# Patient Record
Sex: Male | Born: 1970
Health system: Southern US, Community
[De-identification: ages and names within clinical notes are randomized; demographics above are authoritative.]

## PROBLEM LIST (undated history)

## (undated) DIAGNOSIS — I10 Essential (primary) hypertension: Secondary | ICD-10-CM

---

## 2016-01-06 ENCOUNTER — Emergency Department (HOSPITAL_BASED_OUTPATIENT_CLINIC_OR_DEPARTMENT_OTHER)
Admission: EM | Admit: 2016-01-06 | Discharge: 2016-01-06 | Disposition: A | Discharge status: Home / Self Care | Payer: Self-pay | Attending: Emergency Medicine | Admitting: Emergency Medicine

## 2016-01-06 ENCOUNTER — Encounter (HOSPITAL_BASED_OUTPATIENT_CLINIC_OR_DEPARTMENT_OTHER): Payer: Self-pay | Admitting: Emergency Medicine

## 2016-01-06 DIAGNOSIS — F17210 Nicotine dependence, cigarettes, uncomplicated: Secondary | ICD-10-CM | POA: Insufficient documentation

## 2016-01-06 DIAGNOSIS — Z202 Contact with and (suspected) exposure to infections with a predominantly sexual mode of transmission: Secondary | ICD-10-CM | POA: Insufficient documentation

## 2016-01-06 MED ORDER — METRONIDAZOLE 500 MG PO TABS: 500.0000 mg | ORAL_TABLET | Freq: Two times a day (BID) | ORAL | 0 refills | Status: DC

## 2016-01-06 MED ORDER — METRONIDAZOLE 500 MG PO TABS
2000.0000 mg | ORAL_TABLET | Freq: Once | ORAL | Status: AC
  Administered 2016-01-06: 2000 mg via ORAL
  Filled 2016-01-06: qty 4

## 2016-01-06 NOTE — ED Provider Notes (Signed)
[facility]   CSN: [id number] Arrival date & time: 01/06/16  0130     History   Chief Complaint Chief Complaint  Patient presents with  . STD check    HPI Elijah Wood is a 45 y.o. male.  Patient is a 45 year old male who presents with complaints of STD exposure. He reports his significant other was recently diagnosed with trichomonas. The patient has no symptoms otherwise. He denies any dysuria, urethral discharge, fevers, abdominal pain.   The history is provided by the patient.    History reviewed. No pertinent past medical history.  There are no active problems to display for this patient.   History reviewed. No pertinent surgical history.     Home Medications    Prior to Admission medications   Medication Sig Start Date End Date Taking? Authorizing Provider  metroNIDAZOLE (FLAGYL) 500 MG tablet Take 1 tablet (500 mg total) by mouth 2 (two) times daily. 01/06/16   Geoffery Lyonsouglas Rue Tinnel, MD    Family History No family history on file.  Social History Social History  Substance Use Topics  . Smoking status: Current Every Day Smoker    Packs/day: 0.50    Types: Cigarettes  . Smokeless tobacco: Never Used  . Alcohol use Yes     Comment: rarely     Allergies   Patient has no known allergies.   Review of Systems Review of Systems  All other systems reviewed and are negative.    Physical Exam Updated Vital Signs BP 148/99 (BP Location: Left Arm)   Pulse 66   Temp 98.4 F (36.9 C) (Oral)   Resp 16   Ht 6\' 2"  (1.88 m)   Wt 217 lb 3.2 oz (98.5 kg)   SpO2 98%   BMI 27.89 kg/m   Physical Exam  Constitutional: He is oriented to person, place, and time. He appears well-developed and well-nourished. No distress.  HENT:  Head: Normocephalic and atraumatic.  Mouth/Throat: Oropharynx is clear and moist.  Neck: Normal range of motion. Neck supple.  Cardiovascular: Normal rate and regular rhythm.  Exam reveals no friction rub.    No murmur heard. Pulmonary/Chest: Effort normal and breath sounds normal. No respiratory distress. He has no wheezes. He has no rales.  Abdominal: Soft. Bowel sounds are normal. He exhibits no distension. There is no tenderness.  Musculoskeletal: Normal range of motion. He exhibits no edema.  Neurological: He is alert and oriented to person, place, and time. Coordination normal.  Skin: Skin is warm and dry. He is not diaphoretic.  Nursing note and vitals reviewed.    ED Treatments / Results  Labs (all labs ordered are listed, but only abnormal results are displayed) Labs Reviewed - No data to display  EKG  EKG Interpretation None       Radiology No results found.  Procedures Procedures (including critical care time)  Medications Ordered in ED Medications  metroNIDAZOLE (FLAGYL) tablet 2,000 mg (2,000 mg Oral Given 01/06/16 0221)     Initial Impression / Assessment and Plan / ED Course  I have reviewed the triage vital signs and the nursing notes.  Pertinent labs & imaging results that were available during my care of the patient were reviewed by me and considered in my medical decision making (see chart for details).  Clinical Course     Patient was offered testing for gonorrhea and chlamydia, however he declines. He tells me he has had these in the past and this is not it. He  is just requesting treatment for the trichomonas which his significant other was recently diagnosed with. He will be given by mouth Flagyl and discharged to home. To return as needed for any problems. He was also advised to avoid sexual contact with his partner for 2 weeks to avoid reinfection.  Final Clinical Impressions(s) / ED Diagnoses   Final diagnoses:  STD exposure    New Prescriptions Discharge Medication List as of 01/06/2016  2:06 AM    START taking these medications   Details  metroNIDAZOLE (FLAGYL) 500 MG tablet Take 1 tablet (500 mg total) by mouth 2 (two) times daily.,  Starting Wed 01/06/2016, Print         Geoffery Lyonsouglas Jairo Bellew, MD 01/06/16 [phone]

## 2016-01-06 NOTE — ED Triage Notes (Signed)
Pt has been exposed to Trich. Wants to be checked.

## 2016-01-06 NOTE — ED Notes (Signed)
Pt verbalizes understanding of d/c instructions and denies any further needs at this time. 

## 2016-01-06 NOTE — Discharge Instructions (Signed)
Flagyl as prescribed.  Have only protected sex for the next 2 weeks.

## 2016-01-06 NOTE — ED Notes (Signed)
Pt denies any symptoms and per Dr. Judd Lienelo, declined any testing for STDs at this time, just wants treatment for what he was reportedly exposed to.

## 2016-01-29 ENCOUNTER — Emergency Department (HOSPITAL_BASED_OUTPATIENT_CLINIC_OR_DEPARTMENT_OTHER)
Admission: EM | Admit: 2016-01-29 | Discharge: 2016-01-29 | Disposition: A | Discharge status: Home / Self Care | Payer: Self-pay | Attending: Emergency Medicine | Admitting: Emergency Medicine

## 2016-01-29 ENCOUNTER — Encounter (HOSPITAL_BASED_OUTPATIENT_CLINIC_OR_DEPARTMENT_OTHER): Payer: Self-pay

## 2016-01-29 DIAGNOSIS — L234 Allergic contact dermatitis due to dyes: Secondary | ICD-10-CM | POA: Insufficient documentation

## 2016-01-29 DIAGNOSIS — F17210 Nicotine dependence, cigarettes, uncomplicated: Secondary | ICD-10-CM | POA: Insufficient documentation

## 2016-01-29 MED ORDER — METHYLPREDNISOLONE 4 MG PO TBPK: ORAL_TABLET | ORAL | 0 refills | Status: DC

## 2016-01-29 NOTE — ED Triage Notes (Signed)
Pt had a new hair product applied to his scalp and facial hair today and has had burning, itching, redness and swelling since this evening.  He has attempted to wash the product completely out, just got off work and has not been able to take any meds for symptoms.

## 2016-01-29 NOTE — ED Notes (Signed)
Pt verbalizes understanding of d/c instructions and denies any further needs at this time. 

## 2016-01-29 NOTE — ED Provider Notes (Signed)
   [facility] Provider Note: Lowella DellJ. Lane Owin Vignola, MD, FACEP  CSN: [id number] MRN: [id number] ARRIVAL: 01/29/16 at 0338 ROOM: [facility]   CHIEF COMPLAINT  Allergic Reaction   HISTORY OF PRESENT ILLNESS  Elijah Wood is a 45 y.o. male who used an over-the-counter hair dye on his scalp and beard yesterday morning. He has never used this product before. He has subsequently developed itching, swelling, erythema and weeping of the areas reapplied the product. He states his scalp feels tight. Symptoms are moderate. He has attempted to force the product out of his hair but without relief. He denies systemic symptoms such as throat swelling, shortness breath, nausea, vomiting or diarrhea.   History reviewed. No pertinent past medical history.  History reviewed. No pertinent surgical history.  No family history on file.  Social History  Substance Use Topics  . Smoking status: Current Every Day Smoker    Packs/day: 0.50    Types: Cigarettes  . Smokeless tobacco: Never Used  . Alcohol use Yes     Comment: rarely    Prior to Admission medications   Medication Sig Start Date End Date Taking? Authorizing Provider  metroNIDAZOLE (FLAGYL) 500 MG tablet Take 1 tablet (500 mg total) by mouth 2 (two) times daily. 01/06/16   Geoffery Lyonsouglas Delo, MD    Allergies Patient has no known allergies.   REVIEW OF SYSTEMS  Negative except as noted here or in the History of Present Illness.   PHYSICAL EXAMINATION  Initial Vital Signs Blood pressure 134/84, pulse 66, temperature 98.5 F (36.9 C), temperature source Oral, resp. rate 18, height 6\' 2"  (1.88 m), weight 220 lb (99.8 kg), SpO2 98 %.  Examination General: Well-developed, well-nourished male in no acute distress; appearance consistent with age of record HENT: normocephalic; atraumatic Eyes: Normal appearance Neck: supple Heart: regular rate and rhythm Lungs: clear to auscultation bilaterally Abdomen: soft;  nondistended Extremities: No deformity; full range of motion Neurologic: Awake, alert and oriented; motor function intact in all extremities and symmetric; no facial droop Skin: Warm and dry; erythema, edema and areas of weeping of scalp and bearded skin Psychiatric: Normal mood and affect   RESULTS  Summary of this visit's results, reviewed by myself:   EKG Interpretation  Date/Time:    Ventricular Rate:    PR Interval:    QRS Duration:   QT Interval:    QTC Calculation:   R Axis:     Text Interpretation:        Laboratory Studies: No results found for this or any previous visit (from the past 24 hour(s)). Imaging Studies: No results found.  ED COURSE  Nursing notes and initial vitals signs, including pulse oximetry, reviewed.  Vitals:   01/29/16 0349  BP: 134/84  Pulse: 66  Resp: 18  Temp: 98.5 F (36.9 C)  TempSrc: Oral  SpO2: 98%  Weight: 220 lb (99.8 kg)  Height: 6\' 2"  (1.88 m)   The patient was advised to avoid such hair dye products in the future. We will treated with steroid taper.  PROCEDURES    ED DIAGNOSES     ICD-9-CM ICD-10-CM   1. Allergic contact dermatitis due to dyes 692.89 L23.4        Paula LibraJohn Shloima Clinch, MD 01/29/16 [phone]

## 2016-03-05 ENCOUNTER — Emergency Department (HOSPITAL_BASED_OUTPATIENT_CLINIC_OR_DEPARTMENT_OTHER)
Admission: EM | Admit: 2016-03-05 | Discharge: 2016-03-05 | Disposition: A | Discharge status: Home / Self Care | Payer: Self-pay | Attending: Emergency Medicine | Admitting: Emergency Medicine

## 2016-03-05 ENCOUNTER — Encounter (HOSPITAL_BASED_OUTPATIENT_CLINIC_OR_DEPARTMENT_OTHER): Payer: Self-pay | Admitting: Emergency Medicine

## 2016-03-05 ENCOUNTER — Emergency Department (HOSPITAL_BASED_OUTPATIENT_CLINIC_OR_DEPARTMENT_OTHER): Payer: Self-pay

## 2016-03-05 DIAGNOSIS — J069 Acute upper respiratory infection, unspecified: Secondary | ICD-10-CM

## 2016-03-05 DIAGNOSIS — F17210 Nicotine dependence, cigarettes, uncomplicated: Secondary | ICD-10-CM | POA: Insufficient documentation

## 2016-03-05 DIAGNOSIS — J111 Influenza due to unidentified influenza virus with other respiratory manifestations: Secondary | ICD-10-CM | POA: Insufficient documentation

## 2016-03-05 DIAGNOSIS — Z79899 Other long term (current) drug therapy: Secondary | ICD-10-CM | POA: Insufficient documentation

## 2016-03-05 MED ORDER — ACETAMINOPHEN ER 650 MG PO TBCR: 650.0000 mg | EXTENDED_RELEASE_TABLET | Freq: Three times a day (TID) | ORAL | 0 refills | Status: DC | PRN

## 2016-03-05 MED ORDER — ACETAMINOPHEN 500 MG PO TABS
1000.0000 mg | ORAL_TABLET | Freq: Once | ORAL | Status: AC
Start: 2016-03-05 — End: 2016-03-05
  Administered 2016-03-05: 1000 mg via ORAL
  Filled 2016-03-05: qty 2

## 2016-03-05 MED ORDER — IBUPROFEN 600 MG PO TABS: 600.0000 mg | ORAL_TABLET | Freq: Four times a day (QID) | ORAL | 0 refills | Status: DC | PRN

## 2016-03-05 NOTE — ED Provider Notes (Signed)
[facility]   CSN: [id number] Arrival date & time: 03/05/16  0140     History   Chief Complaint Chief Complaint  Patient presents with  . Influenza    HPI Elijah Wood is a 46 y.o. male.  HPI 46 y/o M comes in with cc of cough, aches. Pt started having chills on thusday, then he had body aches, and cough, sore throat. Pt had subjective fevers. Pt hasnt had a flu shot. Cough is wet, but pt isnt able to spit anything out. Pt wasn't feeling well at work, but after work he felt really bad - so he came to the ER.  History reviewed. No pertinent past medical history.  There are no active problems to display for this patient.   History reviewed. No pertinent surgical history.     Home Medications    Prior to Admission medications   Medication Sig Start Date End Date Taking? Authorizing Provider  acetaminophen (TYLENOL 8 HOUR) 650 MG CR tablet Take 1 tablet (650 mg total) by mouth every 8 (eight) hours as needed for pain. 03/05/16   Derwood Kaplan, MD  ibuprofen (ADVIL,MOTRIN) 600 MG tablet Take 1 tablet (600 mg total) by mouth every 6 (six) hours as needed. 03/05/16   Derwood Kaplan, MD  methylPREDNISolone (MEDROL DOSEPAK) 4 MG TBPK tablet Take tapering dose per package instructions. 01/29/16   John Molpus, MD  metroNIDAZOLE (FLAGYL) 500 MG tablet Take 1 tablet (500 mg total) by mouth 2 (two) times daily. 01/06/16   Geoffery Lyons, MD    Family History No family history on file.  Social History Social History  Substance Use Topics  . Smoking status: Current Every Day Smoker    Packs/day: 0.50    Types: Cigarettes  . Smokeless tobacco: Never Used  . Alcohol use Yes     Comment: rarely     Allergies   Patient has no known allergies.   Review of Systems Review of Systems  ROS 10 Systems reviewed and are negative for acute change except as noted in the HPI.    Physical Exam Updated Vital Signs BP 176/83 (BP Location: Right Arm)    Pulse 103   Temp 100.1 F (37.8 C) (Oral)   Resp 26   Ht 6\' 2"  (1.88 m)   Wt 220 lb (99.8 kg)   SpO2 98%   BMI 28.25 kg/m   Physical Exam  Constitutional: He is oriented to person, place, and time. He appears well-developed.  HENT:  Head: Atraumatic.  Mouth/Throat: Oropharynx is clear and moist. No oropharyngeal exudate.  Neck: Neck supple.  Cardiovascular: Normal rate.   Pulmonary/Chest: Effort normal and breath sounds normal. No respiratory distress. He has no wheezes.  Lymphadenopathy:    He has cervical adenopathy.  Neurological: He is alert and oriented to person, place, and time.  Skin: Skin is warm.  Nursing note and vitals reviewed.    ED Treatments / Results  Labs (all labs ordered are listed, but only abnormal results are displayed) Labs Reviewed - No data to display  EKG  EKG Interpretation None       Radiology Dg Chest 2 View  Result Date: 03/05/2016 CLINICAL DATA:  Cough and congestion. EXAM: CHEST  2 VIEW COMPARISON:  None. FINDINGS: The heart size and mediastinal contours are within normal limits. Both lungs are clear. The visualized skeletal structures are unremarkable. IMPRESSION: No active cardiopulmonary disease. Electronically Signed   By: Tollie Eth M.D.   On: 03/05/2016 03:24  Procedures Procedures (including critical care time)  Medications Ordered in ED Medications  acetaminophen (TYLENOL) tablet 1,000 mg (1,000 mg Oral Given 03/05/16 0149)     Initial Impression / Assessment and Plan / ED Course  I have reviewed the triage vital signs and the nursing notes.  Pertinent labs & imaging results that were available during my care of the patient were reviewed by me and considered in my medical decision making (see chart for details).     Pt appears to have viral URI and possibly flu. He is non toxic appearing, immunocompetent individual. PT feels a lot better after he got tylenol here, and his temp has gone down. Strict ER return  precautions have been discussed, and patient is agreeing with the plan and is comfortable with the workup done and the recommendations from the ER.    Final Clinical Impressions(s) / ED Diagnoses   Final diagnoses:  Influenza  Viral upper respiratory tract infection    New Prescriptions New Prescriptions   ACETAMINOPHEN (TYLENOL 8 HOUR) 650 MG CR TABLET    Take 1 tablet (650 mg total) by mouth every 8 (eight) hours as needed for pain.   IBUPROFEN (ADVIL,MOTRIN) 600 MG TABLET    Take 1 tablet (600 mg total) by mouth every 6 (six) hours as needed.     Derwood KaplanAnkit Nancee Brownrigg, MD 03/05/16 [phone]

## 2016-03-05 NOTE — ED Notes (Signed)
Generalized body aches with cough and fever for two days.  Pt states he feels much better after the tylenol.  He is drinking orange juice.

## 2016-03-05 NOTE — Discharge Instructions (Signed)
Please return to the ER if your symptoms worsen; you have increased pain, fevers, chills, inability to keep any medications down, confusion, shortness of breath. Otherwise see the outpatient doctor as requested.

## 2016-03-05 NOTE — ED Triage Notes (Signed)
Pt reports cough, congestion, sore throat body aches x 2 days.

## 2016-03-05 NOTE — ED Notes (Signed)
Pt verbalizes understanding of d/c instructions and denies any further needs at this time. 

## 2016-07-05 ENCOUNTER — Emergency Department (HOSPITAL_BASED_OUTPATIENT_CLINIC_OR_DEPARTMENT_OTHER): Payer: Self-pay

## 2016-07-05 ENCOUNTER — Emergency Department (HOSPITAL_BASED_OUTPATIENT_CLINIC_OR_DEPARTMENT_OTHER)
Admission: EM | Admit: 2016-07-05 | Discharge: 2016-07-05 | Disposition: A | Discharge status: Home / Self Care | Payer: Self-pay | Attending: Emergency Medicine | Admitting: Emergency Medicine

## 2016-07-05 ENCOUNTER — Encounter (HOSPITAL_BASED_OUTPATIENT_CLINIC_OR_DEPARTMENT_OTHER): Payer: Self-pay

## 2016-07-05 DIAGNOSIS — F17210 Nicotine dependence, cigarettes, uncomplicated: Secondary | ICD-10-CM | POA: Insufficient documentation

## 2016-07-05 DIAGNOSIS — R609 Edema, unspecified: Secondary | ICD-10-CM | POA: Insufficient documentation

## 2016-07-05 DIAGNOSIS — R42 Dizziness and giddiness: Secondary | ICD-10-CM | POA: Insufficient documentation

## 2016-07-05 DIAGNOSIS — R0789 Other chest pain: Secondary | ICD-10-CM | POA: Insufficient documentation

## 2016-07-05 LAB — CBC
HCT: 42.3 % (ref 39.0–52.0)
Hemoglobin: 14.1 g/dL (ref 13.0–17.0)
MCH: 29.7 pg (ref 26.0–34.0)
MCHC: 33.3 g/dL (ref 30.0–36.0)
MCV: 89.1 fL (ref 78.0–100.0)
Platelets: 244 10*3/uL (ref 150–400)
RBC: 4.75 MIL/uL (ref 4.22–5.81)
RDW: 13.2 % (ref 11.5–15.5)
WBC: 7.7 10*3/uL (ref 4.0–10.5)

## 2016-07-05 LAB — BASIC METABOLIC PANEL
ANION GAP: 7 (ref 5–15)
BUN: 8 mg/dL (ref 6–20)
CALCIUM: 9.3 mg/dL (ref 8.9–10.3)
CO2: 29 mmol/L (ref 22–32)
Chloride: 103 mmol/L (ref 101–111)
Creatinine, Ser: 1.2 mg/dL (ref 0.61–1.24)
GLUCOSE: 99 mg/dL (ref 65–99)
POTASSIUM: 4.3 mmol/L (ref 3.5–5.1)
SODIUM: 139 mmol/L (ref 135–145)

## 2016-07-05 LAB — TROPONIN I
Troponin I: <0.03 ng/mL (ref <0.03)
Troponin I: <0.03 ng/mL (ref <0.03)

## 2016-07-05 MED ORDER — ASPIRIN 81 MG PO CHEW
324.0000 mg | CHEWABLE_TABLET | Freq: Once | ORAL | Status: AC
  Administered 2016-07-05: 324 mg via ORAL
  Filled 2016-07-05: qty 4

## 2016-07-05 NOTE — ED Notes (Signed)
Pt inquired about leaving; RN educated pt about labs that need to be drawn in the next hour. Pt in understanding; states he will stay and wait.

## 2016-07-05 NOTE — Discharge Instructions (Signed)
You may take baby aspirin daily. You may also try over-the-counter reflux medications such as Prilosec for the next 2 weeks.  Follow-up with cardiology as soon as he can for evaluation of ear chest pain. Return to the ED if anyconcerning symptoms develop.

## 2016-07-05 NOTE — ED Notes (Signed)
ED Provider at bedside. 

## 2016-07-05 NOTE — ED Notes (Signed)
Patient transported to X-ray 

## 2016-07-05 NOTE — ED Notes (Signed)
Attempted to start IV, able to obtain blood but unable to thread iv

## 2016-07-05 NOTE — ED Notes (Signed)
Family at bedside. 

## 2016-07-05 NOTE — ED Triage Notes (Signed)
CP x 30 min-had CP last week-did not seek medical attention-NAD-steady gait

## 2016-07-05 NOTE — ED Provider Notes (Signed)
[facility]   CSN: [id number] Arrival date & time: 07/05/16  1122     History   Chief Complaint Chief Complaint  Patient presents with  . Chest Pain    HPI Elijah Wood is a 46 y.o. male who presents a with chief complaint acute onset, intermittent, progressive improving chest pain which began earlier today 1 hour ago. He states that pain is midsternal, does not radiate and feels like a "squeezing heart pain". Pain came on while he was using the restroom and was 6/10 at its worst and has improved now. Pain is intermittent and currently, not related to activity. He states that he had a very similar episode last week which lasted for a few minutes before resolving with rest. He states his initial episode today was accompanied by lightheadedness, but denies diaphoresis, syncope, nausea, vomiting, shortness of breath. He denies heart palpitations, leg swelling, orthopnea, PND. He is currently a smoker of 6-7 cigarettes daily. He has not tried anything for his symptoms. He denies history of hypertension, hyperlipidemia, angina, and has not had a stress test or catheterization in the past.  The history is provided by the patient.    History reviewed. No pertinent past medical history.  There are no active problems to display for this patient.   History reviewed. No pertinent surgical history.     Home Medications    Prior to Admission medications   Not on File    Family History No family history on file.  Social History Social History  Substance Use Topics  . Smoking status: Current Every Day Smoker    Packs/day: 0.50    Types: Cigarettes  . Smokeless tobacco: Never Used  . Alcohol use Yes     Comment: occ     Allergies   Patient has no known allergies.   Review of Systems Review of Systems  Constitutional: Negative for chills and fever.  Respiratory: Negative for shortness of breath.   Cardiovascular: Positive for chest pain.  Negative for palpitations and leg swelling.  Gastrointestinal: Negative for abdominal pain, nausea and vomiting.  Neurological: Positive for light-headedness. Negative for syncope and headaches.  All other systems reviewed and are negative.    Physical Exam Updated Vital Signs BP (!) 153/99   Pulse 62   Temp 98.4 F (36.9 C) (Oral)   Resp 17   SpO2 99%   Physical Exam  Constitutional: He appears well-developed and well-nourished.  HENT:  Head: Normocephalic and atraumatic.  Eyes: Conjunctivae are normal. Right eye exhibits no discharge. Left eye exhibits no discharge.  Neck: No JVD present. No tracheal deviation present.  Cardiovascular: Normal rate, regular rhythm, normal heart sounds and intact distal pulses.   2+ radial and DP/PT pulses bl, negative Homan's bl   Pulmonary/Chest: Effort normal and breath sounds normal. No respiratory distress. He exhibits tenderness.  Left anterior chest wall ttp near the sternum  Abdominal: Soft. Bowel sounds are normal. He exhibits no distension. There is no tenderness.  Musculoskeletal: He exhibits edema.  No midline spine TTP.  Neurological: He is alert.  Skin: Skin is warm and dry. Capillary refill takes less than 2 seconds.  Psychiatric: He has a normal mood and affect. His behavior is normal.     ED Treatments / Results  Labs (all labs ordered are listed, but only abnormal results are displayed) Labs Reviewed  CBC  TROPONIN I  BASIC METABOLIC PANEL  TROPONIN I    EKG  EKG Interpretation  Date/Time:  Tuesday Jul 05 2016 11:27:01 EDT Ventricular Rate:  75 PR Interval:  148 QRS Duration: 92 QT Interval:  372 QTC Calculation: 415 R Axis:   75 Text Interpretation:  Normal sinus rhythm Minimal voltage criteria for LVH, may be normal variant Borderline ECG No previous ECGs available Confirmed by Vanetta MuldersZackowski, Scott [phone]([id number]) on 07/05/2016 11:30:33 AM       Radiology Dg Chest 2 View  Result Date: 07/05/2016 CLINICAL DATA:   Left-sided chest pain EXAM: CHEST  2 VIEW COMPARISON:  03/05/2016 FINDINGS: The heart size and mediastinal contours are within normal limits. Both lungs are clear. The visualized skeletal structures are unremarkable. IMPRESSION: No active cardiopulmonary disease. Electronically Signed   By: Kennith CenterEric  Mansell M.D.   On: 07/05/2016 12:34    Procedures Procedures (including critical care time)  Medications Ordered in ED Medications  aspirin chewable tablet 324 mg (324 mg Oral Given 07/05/16 1255)     Initial Impression / Assessment and Plan / ED Course  I have reviewed the triage vital signs and the nursing notes.  Pertinent labs & imaging results that were available during my care of the patient were reviewed by me and considered in my medical decision making (see chart for details).     Patient with acute onset chest pain, second episode in 1 week. Afebrile, hypertensive while in ED. he states that he does not believe he has hypertension, but is not followed by primary care so he is unsure. Chest x-ray shows no active card pulmonary disease. EKG shows normal sinus rhythm. Delta troponin is negative. Remainder labwork unremarkable. Low suspicion of PE, pneumonia, bronchitis, pericarditis. Heart score of 3, patient is low risk for ACS or MI. On reevaluation patient is resting comfortably and states that pain resolved after full size aspirin given in ED. No further emergent workup required at this time. Suspect stable angina versus reflux. Recommend patient start course of OTC GERD medications for the next 2 weeks to see if symptoms improve. He is stable for discharge home with follow-up with cardiology to establish cardiac workup. Stressed the importance of follow-up to patient. Discussed indications for return to the ED. Pt verbalized understanding of and agreement with plan and is safe for discharge home at this time with no complaints.   Final Clinical Impressions(s) / ED Diagnoses   Final  diagnoses:  Atypical chest pain    New Prescriptions There are no discharge medications for this patient.    Jeanie SewerFawze, Abdulhadi Stopa A, PA-C 07/05/16 1707    Vanetta MuldersZackowski, Scott, MD 07/07/16 1128

## 2016-10-18 ENCOUNTER — Emergency Department (HOSPITAL_BASED_OUTPATIENT_CLINIC_OR_DEPARTMENT_OTHER)
Admission: EM | Admit: 2016-10-18 | Discharge: 2016-10-18 | Disposition: A | Discharge status: Home / Self Care | Payer: Self-pay | Attending: Emergency Medicine | Admitting: Emergency Medicine

## 2016-10-18 ENCOUNTER — Encounter (HOSPITAL_BASED_OUTPATIENT_CLINIC_OR_DEPARTMENT_OTHER): Payer: Self-pay | Admitting: Emergency Medicine

## 2016-10-18 DIAGNOSIS — X58XXXA Exposure to other specified factors, initial encounter: Secondary | ICD-10-CM | POA: Insufficient documentation

## 2016-10-18 DIAGNOSIS — F17210 Nicotine dependence, cigarettes, uncomplicated: Secondary | ICD-10-CM | POA: Insufficient documentation

## 2016-10-18 DIAGNOSIS — Y929 Unspecified place or not applicable: Secondary | ICD-10-CM | POA: Insufficient documentation

## 2016-10-18 DIAGNOSIS — Y999 Unspecified external cause status: Secondary | ICD-10-CM | POA: Insufficient documentation

## 2016-10-18 DIAGNOSIS — Y939 Activity, unspecified: Secondary | ICD-10-CM | POA: Insufficient documentation

## 2016-10-18 DIAGNOSIS — S39012A Strain of muscle, fascia and tendon of lower back, initial encounter: Secondary | ICD-10-CM | POA: Insufficient documentation

## 2016-10-18 LAB — URINALYSIS, ROUTINE W REFLEX MICROSCOPIC
BILIRUBIN URINE: NEGATIVE
Glucose, UA: NEGATIVE mg/dL
Ketones, ur: NEGATIVE mg/dL
LEUKOCYTES UA: NEGATIVE
NITRITE: NEGATIVE
Protein, ur: NEGATIVE mg/dL
SPECIFIC GRAVITY, URINE: 1.025 (ref 1.005–1.030)
pH: 5 (ref 5.0–8.0)

## 2016-10-18 LAB — URINALYSIS, MICROSCOPIC (REFLEX)

## 2016-10-18 NOTE — ED Provider Notes (Signed)
[facility] Provider Note   CSN: [id number] Arrival date & time: 10/18/16  1527     History   Chief Complaint Chief Complaint  Patient presents with  . Back Pain    HPI Elijah Wood is a 46 y.o. male.  Patient presents with complaint of improving, left lower back pain which started acutely 5 days ago when he was bent over and twisted. Back was very stiff at onset but has improved with time. Now he only feels pain with certain positions. Patient was told by friends that he needed to get his kidney checked, prompting emergency department visit. Patient has had no irritative urinary infection symptoms, hematuria, persistent flank pain suggestive of kidney stone. Patient has been using Tylenol with relief. Patient denies warning symptoms of back pain including: fecal incontinence, urinary retention or overflow incontinence, night sweats, waking from sleep with back pain, unexplained fevers or weight loss, h/o cancer, IVDU, recent trauma.         History reviewed. No pertinent past medical history.  There are no active problems to display for this patient.   History reviewed. No pertinent surgical history.     Home Medications    Prior to Admission medications   Not on File    Family History History reviewed. No pertinent family history.  Social History Social History  Substance Use Topics  . Smoking status: Current Every Day Smoker    Packs/day: 0.50    Types: Cigarettes  . Smokeless tobacco: Never Used  . Alcohol use Yes     Comment: occ     Allergies   Patient has no known allergies.   Review of Systems Review of Systems  Constitutional: Negative for fever and unexpected weight change.  Gastrointestinal: Negative for constipation.       Neg for fecal incontinence  Genitourinary: Negative for difficulty urinating, flank pain and hematuria.       Negative for urinary incontinence or retention  Musculoskeletal: Positive for back pain.    Neurological: Negative for weakness and numbness.       Negative for saddle paresthesias      Physical Exam Updated Vital Signs BP (!) 147/99 (BP Location: Left Arm)   Pulse 77   Temp 98.7 F (37.1 C) (Oral)   Resp 18   Ht 6\' 2"  (1.88 m)   Wt 99.8 kg (220 lb)   SpO2 100%   BMI 28.25 kg/m   Physical Exam  Constitutional: He appears well-developed and well-nourished.  HENT:  Head: Normocephalic and atraumatic.  Eyes: Conjunctivae are normal.  Neck: Normal range of motion.  Abdominal: Soft. There is no tenderness. There is no CVA tenderness.  Musculoskeletal: Normal range of motion.       Cervical back: He exhibits normal range of motion, no tenderness and no bony tenderness.       Thoracic back: He exhibits normal range of motion, no tenderness and no bony tenderness.       Lumbar back: He exhibits tenderness. He exhibits normal range of motion and no bony tenderness.       Back:  No step-off noted with palpation of spine.   Neurological: He is alert. He has normal reflexes. No sensory deficit. He exhibits normal muscle tone.  5/5 strength in entire lower extremities bilaterally. No sensation deficit.   Skin: Skin is warm and dry.  Psychiatric: He has a normal mood and affect.  Nursing note and vitals reviewed.    ED Treatments / Results  Labs (  all labs ordered are listed, but only abnormal results are displayed) Labs Reviewed  URINALYSIS, ROUTINE W REFLEX MICROSCOPIC - Abnormal; Notable for the following:       Result Value   Hgb urine dipstick TRACE (*)    All other components within normal limits  URINALYSIS, MICROSCOPIC (REFLEX) - Abnormal; Notable for the following:    Bacteria, UA RARE (*)    Squamous Epithelial / LPF 0-5 (*)    All other components within normal limits    EKG  EKG Interpretation None       Radiology No results found.  Procedures Procedures (including critical care time)  Medications Ordered in ED Medications - No data to  display   Initial Impression / Assessment and Plan / ED Course  I have reviewed the triage vital signs and the nursing notes.  Pertinent labs & imaging results that were available during my care of the patient were reviewed by me and considered in my medical decision making (see chart for details).     6:56 PM Patient seen and examined.   Vital signs reviewed and are as follows: Vitals:   10/18/16 1532  BP: (!) 147/99  Pulse: 77  Resp: 18  Temp: 98.7 F (37.1 C)  SpO2: 100%    No red flag s/s of low back pain. Patient was counseled on back pain precautions and told to do activity as tolerated but do not lift, push, or pull heavy objects more than 10 pounds for the next week. Patient to continue conservative measures at home. Patient counseled to use ice or heat on back for no longer than 15 minutes every hour.   Patient urged to follow-up with PCP if pain does not improve with treatment and rest or if pain becomes recurrent. Urged to return with worsening severe pain, loss of bowel or bladder control, trouble walking.   The patient verbalizes understanding and agrees with the plan.   Final Clinical Impressions(s) / ED Diagnoses   Final diagnoses:  Strain of lumbar region, initial encounter   Patient with back pain. No neurological deficits. Patient is ambulatory. No warning symptoms of back pain including: fecal incontinence, urinary retention or overflow incontinence, night sweats, waking from sleep with back pain, unexplained fevers or weight loss, h/o cancer, IVDU, recent trauma. No concern for cauda equina, epidural abscess, or other serious cause of back pain. Conservative measures such as rest, ice/heat and pain medicine indicated with PCP follow-up if no improvement with conservative management.     New Prescriptions New Prescriptions   No medications on file     Renne Crigler, Cordelia Poche 10/18/16 1857    Nira Conn, MD 10/19/16 [phone]

## 2016-10-18 NOTE — Discharge Instructions (Signed)
Please read and follow all provided instructions.  Your diagnoses today include:  1. Strain of lumbar region, initial encounter    Tests performed today include:  Vital signs - see below for your results today  Medications prescribed:   None  Take any prescribed medications only as directed.  Home care instructions:   Follow any educational materials contained in this packet  Please rest, use ice or heat on your back for the next several days  Do not lift, push, pull anything more than 10 pounds for the next week  Follow-up instructions: Please follow-up with your primary care provider in the next 1 week for further evaluation of your symptoms if not resolved.   Return instructions:  SEEK IMMEDIATE MEDICAL ATTENTION IF YOU HAVE:  New numbness, tingling, weakness, or problem with the use of your arms or legs  Severe back pain not relieved with medications  Loss control of your bowels or bladder  Increasing pain in any areas of the body (such as chest or abdominal pain)  Shortness of breath, dizziness, or fainting.   Worsening nausea (feeling sick to your stomach), vomiting, fever, or sweats  Any other emergent concerns regarding your health   Additional Information:  Your vital signs today were: BP (!) 147/99 (BP Location: Left Arm)    Pulse 77    Temp 98.7 F (37.1 C) (Oral)    Resp 18    Ht  (1.88 m)    Wt 99.8 kg (220 lb)    SpO2 100%    BMI 28.25 kg/m  If your blood pressure (BP) was elevated above 135/85 this visit, please have this repeated by your doctor within one month. --------------

## 2016-10-18 NOTE — ED Notes (Signed)
Pt admits that he bends over frequently at work at the back rather than squatting.

## 2016-10-18 NOTE — ED Notes (Signed)
Pt verbalized understanding of discharge instructions and denies any further questions at this time.   

## 2016-10-18 NOTE — ED Triage Notes (Signed)
Patient states that he has had lower back pain since Thursday  - he reports that it has been better, but today it got worse again. He states that it hurts worse with movement

## 2017-03-26 ENCOUNTER — Emergency Department (HOSPITAL_BASED_OUTPATIENT_CLINIC_OR_DEPARTMENT_OTHER)
Admission: EM | Admit: 2017-03-26 | Discharge: 2017-03-26 | Disposition: A | Discharge status: Home / Self Care | Payer: Self-pay | Attending: Emergency Medicine | Admitting: Emergency Medicine

## 2017-03-26 ENCOUNTER — Other Ambulatory Visit: Payer: Self-pay

## 2017-03-26 ENCOUNTER — Encounter (HOSPITAL_BASED_OUTPATIENT_CLINIC_OR_DEPARTMENT_OTHER): Payer: Self-pay | Admitting: Emergency Medicine

## 2017-03-26 DIAGNOSIS — K047 Periapical abscess without sinus: Secondary | ICD-10-CM | POA: Insufficient documentation

## 2017-03-26 DIAGNOSIS — F17210 Nicotine dependence, cigarettes, uncomplicated: Secondary | ICD-10-CM | POA: Insufficient documentation

## 2017-03-26 MED ORDER — PENICILLIN V POTASSIUM 250 MG PO TABS
500.0000 mg | ORAL_TABLET | Freq: Once | ORAL | Status: AC
  Administered 2017-03-26: 500 mg via ORAL
  Filled 2017-03-26: qty 2

## 2017-03-26 MED ORDER — PENICILLIN V POTASSIUM 500 MG PO TABS: 500.0000 mg | ORAL_TABLET | Freq: Three times a day (TID) | ORAL | 0 refills | Status: DC

## 2017-03-26 MED ORDER — NAPROXEN 500 MG PO TABS: 500.0000 mg | ORAL_TABLET | Freq: Two times a day (BID) | ORAL | 0 refills | Status: DC

## 2017-03-26 NOTE — ED Provider Notes (Signed)
[facility] EMERGENCY DEPARTMENT Provider Note   CSN: [id number] Arrival date & time: 03/26/17  1056     History   Chief Complaint Chief Complaint  Patient presents with  . Abscess    dental    HPI Elijah Wood is a 47 y.o. male.  Patient presents the emergency department with complaint of left-sided upper gum and facial swelling for the past day and a half.  Patient has had symptoms similar to this before.  No difficulty swallowing, or breathing.  No neck swelling.  No treatments prior to arrival.  He has minor pain over the left face that does not radiate.  No history of diabetes.  Onset of symptoms acute.  Course is constant and not worsening.  Nothing makes symptoms better or worse.      History reviewed. No pertinent past medical history.  There are no active problems to display for this patient.   History reviewed. No pertinent surgical history.     Home Medications    Prior to Admission medications   Medication Sig Start Date End Date Taking? Authorizing Provider  naproxen (NAPROSYN) 500 MG tablet Take 1 tablet (500 mg total) by mouth 2 (two) times daily. 03/26/17   Renne CriglerGeiple, Booker Bhatnagar, PA-C  penicillin v potassium (VEETID) 500 MG tablet Take 1 tablet (500 mg total) by mouth 3 (three) times daily. 03/26/17   Renne CriglerGeiple, Taliah Porche, PA-C    Family History History reviewed. No pertinent family history.  Social History Social History   Tobacco Use  . Smoking status: Current Every Day Smoker    Packs/day: 0.50    Types: Cigarettes  . Smokeless tobacco: Never Used  Substance Use Topics  . Alcohol use: Yes    Comment: occ  . Drug use: No     Allergies   Patient has no known allergies.   Review of Systems Review of Systems  Constitutional: Negative for fever.  HENT: Positive for dental problem and facial swelling. Negative for ear pain, sore throat and trouble swallowing.   Respiratory: Negative for shortness of breath and stridor.     Musculoskeletal: Negative for neck pain.  Skin: Negative for color change.  Neurological: Negative for headaches.     Physical Exam Updated Vital Signs BP 132/83 (BP Location: Left Arm)   Pulse (!) 59   Temp 98.8 F (37.1 C) (Oral)   Resp 18   Ht 6\' 2"  (1.88 m)   Wt 99.8 kg (220 lb)   SpO2 100%   BMI 28.25 kg/m   Physical Exam  Constitutional: He appears well-developed and well-nourished.  HENT:  Head: Normocephalic and atraumatic.  Right Ear: Tympanic membrane, external ear and ear canal normal.  Left Ear: Tympanic membrane, external ear and ear canal normal.  Nose: Nose normal.  Mouth/Throat: Uvula is midline, oropharynx is clear and moist and mucous membranes are normal. No trismus in the jaw. Abnormal dentition. Dental caries present. No dental abscesses or uvula swelling. No tonsillar abscesses.  Patient with very poor dentition and advanced periodontal disease.  There is mild fluctuance palpable at the approximate location of tooth #16.  Abscess is small.  Multiple cavities and missing teeth.  Eyes: Pupils are equal, round, and reactive to light.  Neck: Normal range of motion. Neck supple.  No neck swelling or Lugwig's angina  Neurological: He is alert.  Skin: Skin is warm and dry.  Psychiatric: He has a normal mood and affect.  Nursing note and vitals reviewed.    ED Treatments /  Results   Procedures Procedures (including critical care time)  Medications Ordered in ED Medications  penicillin v potassium (VEETID) tablet 500 mg (not administered)     Initial Impression / Assessment and Plan / ED Course  I have reviewed the triage vital signs and the nursing notes.  Pertinent labs & imaging results that were available during my care of the patient were reviewed by me and considered in my medical decision making (see chart for details).     Patient seen and examined.  Medications ordered.   Vital signs reviewed and are as follows: BP 132/83 (BP  Location: Left Arm)   Pulse (!) 59   Temp 98.8 F (37.1 C) (Oral)   Resp 18   Ht 6\' 2"  (1.88 m)   Wt 99.8 kg (220 lb)   SpO2 100%   BMI 28.25 kg/m    Patient counseled to take prescribed medications as directed, return with worsening facial or neck swelling, and to follow-up with their dentist as soon as possible.    Final Clinical Impressions(s) / ED Diagnoses   Final diagnoses:  Dental abscess   Patient with toothache. No fever. Exam unconcerning for Ludwig's angina or other deep tissue infection in neck.   As there is gum swelling, erythema, and facial swelling, will treat with antibiotic and pain medicine. Urged patient to follow-up with dentist.     ED Discharge Orders        Ordered    penicillin v potassium (VEETID) 500 MG tablet  3 times daily     03/26/17 1231    naproxen (NAPROSYN) 500 MG tablet  2 times daily     03/26/17 1231       Renne Crigler, PA-C 03/26/17 1235    Little, Ambrose Finland, MD 03/27/17 1555

## 2017-03-26 NOTE — Discharge Instructions (Signed)
Please read and follow all provided instructions.  Your diagnoses today include:  1. Dental abscess     The exam and treatment you received today has been provided on an emergency basis only. This is not a substitute for complete medical or dental care.  Tests performed today include:  Vital signs. See below for your results today.   Medications prescribed:   Penicillin - antibiotic  You have been prescribed an antibiotic medicine: take the entire course of medicine even if you are feeling better. Stopping early can cause the antibiotic not to work.   Naproxen - anti-inflammatory pain medication  Do not exceed 500mg  naproxen every 12 hours, take with food  You have been prescribed an anti-inflammatory medication or NSAID. Take with food. Take smallest effective dose for the shortest duration needed for your pain. Stop taking if you experience stomach pain or vomiting.   Take any prescribed medications only as directed.  Home care instructions:  Follow any educational materials contained in this packet.  Follow-up instructions: Please follow-up with your dentist for further evaluation of your symptoms.   Dental Assistance: See attached dental referrals  Return instructions:   Please return to the Emergency Department if you experience worsening symptoms.  Please return if you develop a fever, you develop more swelling in your face or neck, you have trouble breathing or swallowing food.  Please return if you have any other emergent concerns.  Additional Information:  Your vital signs today were: BP 132/83 (BP Location: Left Arm)    Pulse (!) 59    Temp 98.8 F (37.1 C) (Oral)    Resp 18    Ht 6\' 2"  (1.88 m)    Wt 99.8 kg (220 lb)    SpO2 100%    BMI 28.25 kg/m  If your blood pressure (BP) was elevated above 135/85 this visit, please have this repeated by your doctor within one month. --------------

## 2017-03-26 NOTE — ED Triage Notes (Signed)
Patient states that he has swelling to his gums on the left side since Friday - "I just need an antibiotic"

## 2017-06-02 ENCOUNTER — Encounter (HOSPITAL_BASED_OUTPATIENT_CLINIC_OR_DEPARTMENT_OTHER): Payer: Self-pay

## 2017-06-02 ENCOUNTER — Other Ambulatory Visit: Payer: Self-pay

## 2017-06-02 ENCOUNTER — Emergency Department (HOSPITAL_BASED_OUTPATIENT_CLINIC_OR_DEPARTMENT_OTHER)
Admission: EM | Admit: 2017-06-02 | Discharge: 2017-06-02 | Disposition: A | Discharge status: Home / Self Care | Payer: Self-pay | Attending: Emergency Medicine | Admitting: Emergency Medicine

## 2017-06-02 DIAGNOSIS — Z79899 Other long term (current) drug therapy: Secondary | ICD-10-CM | POA: Insufficient documentation

## 2017-06-02 DIAGNOSIS — Z87891 Personal history of nicotine dependence: Secondary | ICD-10-CM | POA: Insufficient documentation

## 2017-06-02 DIAGNOSIS — K047 Periapical abscess without sinus: Secondary | ICD-10-CM

## 2017-06-02 MED ORDER — ACETAMINOPHEN 325 MG PO TABS: 650.0000 mg | ORAL_TABLET | Freq: Four times a day (QID) | ORAL | 0 refills | Status: DC | PRN

## 2017-06-02 MED ORDER — AMOXICILLIN-POT CLAVULANATE 875-125 MG PO TABS: 1.0000 | ORAL_TABLET | Freq: Two times a day (BID) | ORAL | 0 refills | Status: AC

## 2017-06-02 NOTE — ED Provider Notes (Signed)
[facility] EMERGENCY DEPARTMENT Provider Note   CSN: [id number] Arrival date & time: 06/02/17  2020     History   Chief Complaint Chief Complaint  Patient presents with  . Abscess    HPI Elijah Wood is a 47 y.o. male.  HPI   Patient is a 47 year old male who is presenting to the ED today complaining of an abscess to the roof of his mouth that began 2 days ago.  States that the area is painful.  Pain is constant in nature and was gradual in onset.  Has not tried taking any medications for the pain.  Denies any associated sore throat, difficulty swallowing, difficulty opening his mouth, facial swelling, ear pain, or any other symptoms.  He does not have a dentist.  History reviewed. No pertinent past medical history.  There are no active problems to display for this patient.   History reviewed. No pertinent surgical history.      Home Medications    Prior to Admission medications   Medication Sig Start Date End Date Taking? Authorizing Provider  acetaminophen (TYLENOL) 325 MG tablet Take 2 tablets (650 mg total) by mouth every 6 (six) hours as needed. Do not take more than 4000mg  of tylenol per day 06/02/17   Toddy Boyd S, PA-C  amoxicillin-clavulanate (AUGMENTIN) 875-125 MG tablet Take 1 tablet by mouth every 12 (twelve) hours for 7 days. 06/02/17 06/09/17  Kayani Rapaport S, PA-C  naproxen (NAPROSYN) 500 MG tablet Take 1 tablet (500 mg total) by mouth 2 (two) times daily. 03/26/17   Renne Crigler, PA-C  penicillin v potassium (VEETID) 500 MG tablet Take 1 tablet (500 mg total) by mouth 3 (three) times daily. 03/26/17   Renne Crigler, PA-C    Family History No family history on file.  Social History Social History   Tobacco Use  . Smoking status: Former Smoker    Packs/day: 0.50    Types: Cigarettes  . Smokeless tobacco: Never Used  Substance Use Topics  . Alcohol use: Not Currently  . Drug use: No     Allergies   Patient has no known  allergies.   Review of Systems Review of Systems  Constitutional: Negative for fever.  HENT: Positive for dental problem. Negative for sore throat and trouble swallowing.        Abscess to the roof of mouth  Respiratory: Negative for shortness of breath.   Skin:       abscess  Neurological: Negative for dizziness, light-headedness and headaches.     Physical Exam Updated Vital Signs BP (!) 157/109 (BP Location: Left Arm)   Pulse 64   Temp 98.3 F (36.8 C) (Oral)   Resp 18   Ht 6\' 2"  (1.88 m)   Wt 102.7 kg (226 lb 6.6 oz)   SpO2 99%   BMI 29.07 kg/m   Physical Exam  Constitutional: He appears well-developed and well-nourished.  HENT:  Head: Normocephalic and atraumatic.  Nose: Nose normal.  Mouth/Throat: Oropharynx is clear and moist.  Pharyngeal erythema.  No tonsillar swelling or evident of PTR of retropharyngeal abscess.  Uvula midline.  No tonsillar exudates.  Patent airway.  No trismus.  Patient does have small abscess to the hard palate just posterior to teeth numbers 12/13.  Area is fluctuant and tender to palpation.  Patient has diffuse dental caries.  No facial swelling.  No swelling beneath the tongue.  Eyes: Pupils are equal, round, and reactive to light. Conjunctivae and EOM are normal.  Neck:  Normal range of motion. Neck supple.  No swelling to the neck or erythema to the neck.  Cardiovascular: Normal rate.  No murmur heard. Pulmonary/Chest: Effort normal.  Abdominal: Soft.  Musculoskeletal: He exhibits no edema.  Lymphadenopathy:    He has no cervical adenopathy.  Neurological: He is alert.  Mental Status:  Alert, thought content appropriate, able to give a coherent history. Speech fluent without evidence of aphasia. Able to follow 2 step commands without difficulty.  Cranial Nerves:  II:  Peripheral visual fields grossly normal, pupils equal, round, reactive to light III,IV, VI: ptosis not present, extra-ocular motions intact bilaterally  V,VII: smile  symmetric, facial light touch sensation equal VIII: hearing grossly normal to voice  X: uvula elevates symmetrically  XI: bilateral shoulder shrug symmetric and strong XII: midline tongue extension without fassiculations   Skin: Skin is warm and dry.  Psychiatric: He has a normal mood and affect.  Nursing note and vitals reviewed.    ED Treatments / Results  Labs (all labs ordered are listed, but only abnormal results are displayed) Labs Reviewed - No data to display  EKG None  Radiology No results found.  Procedures Procedures (including critical care time)  Medications Ordered in ED Medications - No data to display   Initial Impression / Assessment and Plan / ED Course  I have reviewed the triage vital signs and the nursing notes.  Pertinent labs & imaging results that were available during my care of the patient were reviewed by me and considered in my medical decision making (see chart for details).   Final Clinical Impressions(s) / ED Diagnoses   Final diagnoses:  Dental abscess   Patient with small area of fluctuance to the hard palate that is ttp.  Patient nontoxic-appearing, mildly hypertensive but do not suspect hypertensive urgency/emergency.  Likely elevated due to pain.  Will give Exam unconcerning for Ludwig's angina or spread of infection.  Will treat with Augmentin and pain medicine.  Urged patient to follow-up with dentist.  Resources for dentist given and patient advised to follow-up with any new or worsening symptoms in the meantime.  All questions answered and patient understands plan agrees to return immediately to the ED.   ED Discharge Orders        Ordered    amoxicillin-clavulanate (AUGMENTIN) 875-125 MG tablet  Every 12 hours     06/02/17 2128    acetaminophen (TYLENOL) 325 MG tablet  Every 6 hours PRN     06/02/17 2128       Karrie MeresCouture, Latisia Hilaire S, PA-C 06/02/17 2128    Maia PlanLong, Joshua G, MD 06/03/17 [phone]

## 2017-06-02 NOTE — ED Triage Notes (Signed)
C/o "abscess" in mouth day 2-NAD-steady gait

## 2017-06-02 NOTE — ED Notes (Signed)
Pt discharged to home NAD.  

## 2017-06-02 NOTE — Discharge Instructions (Signed)
You were given a prescription for antibiotics. Please take the antibiotic prescription fully.   I have prescribed a new medication for you today. It is important that when you pick the prescription up you discuss the potential interactions of this medication with other medications you are taking, including over the counter medications, with the pharmacists.   This new medication has potential side effects. Be sure to contact your primary care provider or return to the emergency department if you are experiencing new symptoms that you are unable to tolerate after starting the medication. You need to receive medical evaluation immediately if you start to experience blistering of the skin, rash, swelling, or difficulty breathing as these signs could indicate a more serious medication side effect.   Please follow-up with a dentist as soon as possible.  Return to the emergency department for any new or worsening symptoms.

## 2017-08-23 ENCOUNTER — Encounter (HOSPITAL_BASED_OUTPATIENT_CLINIC_OR_DEPARTMENT_OTHER): Payer: Self-pay | Admitting: Emergency Medicine

## 2017-08-23 ENCOUNTER — Emergency Department (HOSPITAL_BASED_OUTPATIENT_CLINIC_OR_DEPARTMENT_OTHER)
Admission: EM | Admit: 2017-08-23 | Discharge: 2017-08-23 | Disposition: A | Discharge status: Home / Self Care | Payer: Self-pay | Attending: Emergency Medicine | Admitting: Emergency Medicine

## 2017-08-23 ENCOUNTER — Other Ambulatory Visit: Payer: Self-pay

## 2017-08-23 ENCOUNTER — Emergency Department (HOSPITAL_BASED_OUTPATIENT_CLINIC_OR_DEPARTMENT_OTHER): Payer: Self-pay

## 2017-08-23 DIAGNOSIS — Z87891 Personal history of nicotine dependence: Secondary | ICD-10-CM | POA: Insufficient documentation

## 2017-08-23 DIAGNOSIS — Z79899 Other long term (current) drug therapy: Secondary | ICD-10-CM | POA: Insufficient documentation

## 2017-08-23 DIAGNOSIS — Y929 Unspecified place or not applicable: Secondary | ICD-10-CM | POA: Insufficient documentation

## 2017-08-23 DIAGNOSIS — W010XXA Fall on same level from slipping, tripping and stumbling without subsequent striking against object, initial encounter: Secondary | ICD-10-CM | POA: Insufficient documentation

## 2017-08-23 DIAGNOSIS — S92351A Displaced fracture of fifth metatarsal bone, right foot, initial encounter for closed fracture: Secondary | ICD-10-CM

## 2017-08-23 DIAGNOSIS — Y9301 Activity, walking, marching and hiking: Secondary | ICD-10-CM | POA: Insufficient documentation

## 2017-08-23 DIAGNOSIS — Y998 Other external cause status: Secondary | ICD-10-CM | POA: Insufficient documentation

## 2017-08-23 DIAGNOSIS — S92354A Nondisplaced fracture of fifth metatarsal bone, right foot, initial encounter for closed fracture: Secondary | ICD-10-CM | POA: Insufficient documentation

## 2017-08-23 MED ORDER — HYDROCODONE-ACETAMINOPHEN 5-325 MG PO TABS: 1.0000 | ORAL_TABLET | Freq: Four times a day (QID) | ORAL | 0 refills | Status: DC | PRN

## 2017-08-23 MED ORDER — NAPROXEN 250 MG PO TABS
500.0000 mg | ORAL_TABLET | Freq: Once | ORAL | Status: AC
  Administered 2017-08-23: 500 mg via ORAL
  Filled 2017-08-23: qty 2

## 2017-08-23 NOTE — ED Triage Notes (Signed)
Pt c/o 10/10 right foot pain after he twisted tonight prior to got to work, pt brought to the room on wc do to the pain. Some swollen noticed.

## 2017-08-23 NOTE — ED Provider Notes (Addendum)
[facility] Provider Note: Elijah DellJ. Lane Tesa Meadors, MD, FACEP  CSN: [id number] MRN: [id number] ARRIVAL: 08/23/17 at 0019 ROOM: [facility]   CHIEF COMPLAINT  Foot Injury   HISTORY OF PRESENT ILLNESS  08/23/17 1:35 AM Elijah Wood is a 47 y.o. male who was walking down a path about 11 PM and inverted his right ankle.  He is having moderate to severe pain over the base of his right fifth metatarsal.  There is associated swelling.  Pain is worse with palpation or weightbearing.  He denies other injury.  He is having some numbness of the right fourth and fifth toes.  [facility] with [facility] [place] substances [place] reveals the patient has received no opioid prescriptions in the past 2 years.   History reviewed. No pertinent past medical history.  History reviewed. No pertinent surgical history.  No family history on file.  Social History   Tobacco Use  . Smoking status: Former Smoker    Packs/day: 0.50    Types: Cigarettes  . Smokeless tobacco: Never Used  Substance Use Topics  . Alcohol use: Not Currently  . Drug use: No    Prior to Admission medications   Medication Sig Start Date End Date Taking? Authorizing Provider  acetaminophen (TYLENOL) 325 MG tablet Take 2 tablets (650 mg total) by mouth every 6 (six) hours as needed. Do not take more than 4000mg  of tylenol per day 06/02/17   Couture, Cortni S, PA-C  naproxen (NAPROSYN) 500 MG tablet Take 1 tablet (500 mg total) by mouth 2 (two) times daily. 03/26/17   Renne CriglerGeiple, Joshua, PA-C  penicillin v potassium (VEETID) 500 MG tablet Take 1 tablet (500 mg total) by mouth 3 (three) times daily. 03/26/17   Renne CriglerGeiple, Joshua, PA-C    Allergies Patient has no known allergies.   REVIEW OF SYSTEMS  Negative except as noted here or in the History of Present Illness.   PHYSICAL EXAMINATION  Initial Vital Signs Blood pressure (!) 162/93, pulse 75, temperature 98.6 F (37 C), temperature source Oral, resp.  rate 18, height 6\' 2"  (1.88 m), weight 102.1 kg (225 lb), SpO2 99 %.  Examination General: Well-developed, well-nourished male in no acute distress; appearance consistent with age of record HENT: normocephalic; atraumatic Eyes: pupils equal, round and reactive to light; extraocular muscles intact Neck: supple Heart: regular rate and rhythm Lungs: clear to auscultation bilaterally Abdomen: soft; nondistended; nontender; bowel sounds present Extremities: No deformity; tenderness and swelling over base of right fifth metatarsal, foot distally vascularly intact with intact tendon function, somewhat reduced sensation in the fourth and fifth toes Neurologic: Awake, alert and oriented; motor function intact in all extremities and symmetric; no facial droop Skin: Warm and dry Psychiatric: Normal mood and affect   RESULTS  Summary of this visit's results, reviewed by myself:   EKG Interpretation  Date/Time:    Ventricular Rate:    PR Interval:    QRS Duration:   QT Interval:    QTC Calculation:   R Axis:     Text Interpretation:        Laboratory Studies: No results found for this or any previous visit (from the past 24 hour(s)). Imaging Studies: Dg Foot Complete Right  Result Date: 08/23/2017 CLINICAL DATA:  47 year old male with right foot injury. EXAM: RIGHT FOOT COMPLETE - 3+ VIEW COMPARISON:  None. FINDINGS: There is a nondisplaced fracture of the base of the fifth metatarsal. No other acute fracture noted. No arthritic changes. There is no dislocation. The  soft tissues are unremarkable. IMPRESSION: Nondisplaced fracture of the base of the fifth metatarsal. Electronically Signed   By: Elgie Collard M.D.   On: 08/23/2017 01:12    ED COURSE and MDM  Nursing notes and initial vitals signs, including pulse oximetry, reviewed.  Vitals:   08/23/17 0028  BP: (!) 162/93  Pulse: 75  Resp: 18  Temp: 98.6 F (37 C)  TempSrc: Oral  SpO2: 99%  Weight: 102.1 kg (225 lb)  Height:  6\' 2"  (1.88 m)   Patient placed in short leg splint and crutches.  He was advised to remain nonweightbearing pending orthopedic follow-up.  PROCEDURES    ED DIAGNOSES     ICD-10-CM   1. Closed fracture of base of fifth metatarsal bone of right foot, initial encounter S92.351A        Mallissa Lorenzen, MD 08/23/17 0146    Paula Libra, MD 08/23/17 1610

## 2017-08-29 ENCOUNTER — Ambulatory Visit (INDEPENDENT_AMBULATORY_CARE_PROVIDER_SITE_OTHER): Payer: Self-pay | Admitting: Orthopaedic Surgery

## 2017-08-30 ENCOUNTER — Ambulatory Visit (INDEPENDENT_AMBULATORY_CARE_PROVIDER_SITE_OTHER): Payer: Self-pay | Admitting: Orthopedic Surgery

## 2017-08-30 ENCOUNTER — Encounter (INDEPENDENT_AMBULATORY_CARE_PROVIDER_SITE_OTHER): Payer: Self-pay | Admitting: Orthopedic Surgery

## 2017-08-30 VITALS — Ht 74.0 in | Wt 225.0 lb

## 2017-08-30 DIAGNOSIS — S92354A Nondisplaced fracture of fifth metatarsal bone, right foot, initial encounter for closed fracture: Secondary | ICD-10-CM

## 2017-08-30 NOTE — Progress Notes (Signed)
   Office Visit Note   Patient: Elijah Wood           Date of Birth: Feb 26, 1970           MRN: [id number] Visit Date: 08/30/2017              Requested by: No referring provider defined for this encounter. PCP: Patient, No Pcp Per  Chief Complaint  Patient presents with  . Right Foot - Fracture      HPI: The patient is a 47 year old gentleman seen today 1 week after an inversion injury to the right foot. Was seen at [facility]. Placed in posterior splint.   Outside radiographs reviewed.  Assessment & Plan: Visit Diagnoses:  1. Closed nondisplaced fracture of fifth metatarsal bone of right foot, initial encounter     Plan: placed in cam boot. nonweight bearing. Follow up in 2 weeks for repeat radiographs. Elevate and ice for swelling. Antiinflammatories for pain.  Follow-Up Instructions: Return in about 2 weeks (around 09/13/2017).   Ortho Exam  Patient is alert, oriented, no adenopathy, well-dressed, normal affect, normal respiratory effort. Moderate swelling to right lateral foot. No ecchymosis. Tender along metatarsal.   Imaging: No results found. No images are attached to the encounter.  Labs: No results found for: HGBA1C, ESRSEDRATE, CRP, LABURIC, REPTSTATUS, GRAMSTAIN, CULT, LABORGA   No results found for: ALBUMIN, PREALBUMIN, LABURIC  Body mass index is 28.89 kg/m.  Orders:  No orders of the defined types were placed in this encounter.  No orders of the defined types were placed in this encounter.    Procedures: No procedures performed  Clinical Data: No additional findings.  ROS:  All other systems negative, except as noted in the HPI. Review of Systems  Constitutional: Negative for chills and fever.  Musculoskeletal: Positive for arthralgias and joint swelling.    Objective: Vital Signs: Ht 6\' 2"  (1.88 m)   Wt 225 lb (102.1 kg)   BMI 28.89 kg/m   Specialty Comments:  No specialty comments available.  PMFS  History: There are no active problems to display for this patient.  History reviewed. No pertinent past medical history.  History reviewed. No pertinent family history.  History reviewed. No pertinent surgical history. Social History   Occupational History  . Not on file  Tobacco Use  . Smoking status: Former Smoker    Packs/day: 0.50    Types: Cigarettes  . Smokeless tobacco: Never Used  Substance and Sexual Activity  . Alcohol use: Not Currently  . Drug use: No  . Sexual activity: Not on file

## 2017-09-13 ENCOUNTER — Ambulatory Visit (INDEPENDENT_AMBULATORY_CARE_PROVIDER_SITE_OTHER): Payer: Self-pay | Admitting: Family

## 2017-09-13 ENCOUNTER — Ambulatory Visit (INDEPENDENT_AMBULATORY_CARE_PROVIDER_SITE_OTHER): Payer: Self-pay | Admitting: Orthopedic Surgery

## 2017-09-13 ENCOUNTER — Ambulatory Visit (INDEPENDENT_AMBULATORY_CARE_PROVIDER_SITE_OTHER): Payer: Self-pay

## 2017-09-13 ENCOUNTER — Encounter (INDEPENDENT_AMBULATORY_CARE_PROVIDER_SITE_OTHER): Payer: Self-pay | Admitting: Family

## 2017-09-13 VITALS — Ht 74.0 in | Wt 225.0 lb

## 2017-09-13 DIAGNOSIS — S92354D Nondisplaced fracture of fifth metatarsal bone, right foot, subsequent encounter for fracture with routine healing: Secondary | ICD-10-CM

## 2017-09-13 DIAGNOSIS — M79671 Pain in right foot: Secondary | ICD-10-CM

## 2017-09-13 NOTE — Progress Notes (Signed)
   Office Visit Note   Patient: Elijah Wood           Date of Birth: [birth year]           MRN: [id number] Visit Date: 09/13/2017              Requested by: No referring provider defined for this encounter. PCP: Patient, No Pcp Per  Chief Complaint  Patient presents with  . Right Foot - Follow-up    Closed nondisplaced fx 5th MT       HPI: The patient is a 47 year old gentleman seen today 3 weeks after an inversion injury to the right foot. Was seen at [facility]. Placed in posterior splint.   Has been in CAM boot with crutches for last 2 weeks. States has been weight bearing "some on tippy toes."   Assessment & Plan: Visit Diagnoses:  1. Pain in right foot   2. Closed nondisplaced fracture of fifth metatarsal bone of right foot with routine healing, subsequent encounter     Plan: Continue in cam boot. nonweight bearing. Stressed importance of this. Follow up in 2 weeks for repeat radiographs. Will follow up in 2 weeks with Elijah Wood. Discussed that may need to proceed with ORIF if cannot get this to heal with conservative measures.  Follow-Up Instructions: Return in about 2 weeks (around 09/28/2017).   Ortho Exam  Patient is alert, oriented, no adenopathy, well-dressed, normal affect, normal respiratory effort. Moderate swelling to right lateral foot. No ecchymosis. Minimally tender along metatarsal.   Imaging: No results found. No images are attached to the encounter.  Labs: No results found for: HGBA1C, ESRSEDRATE, CRP, LABURIC, REPTSTATUS, GRAMSTAIN, CULT, LABORGA   No results found for: ALBUMIN, PREALBUMIN, LABURIC  Body mass index is 28.89 kg/m.  Orders:  Orders Placed This Encounter  Procedures  . XR Foot Complete Right   No orders of the defined types were placed in this encounter.    Procedures: No procedures performed  Clinical Data: No additional findings.  ROS:  All other systems negative, except as noted in the HPI. Review of  Systems  Constitutional: Negative for chills and fever.  Musculoskeletal: Positive for arthralgias and joint swelling.    Objective: Vital Signs: Ht 6\' 2"  (1.88 m)   Wt 225 lb (102.1 kg)   BMI 28.89 kg/m   Specialty Comments:  No specialty comments available.  PMFS History: There are no active problems to display for this patient.  History reviewed. No pertinent past medical history.  History reviewed. No pertinent family history.  History reviewed. No pertinent surgical history. Social History   Occupational History  . Not on file  Tobacco Use  . Smoking status: Former Smoker    Packs/day: 0.50    Types: Cigarettes  . Smokeless tobacco: Never Used  Substance and Sexual Activity  . Alcohol use: Not Currently  . Drug use: No  . Sexual activity: Not on file

## 2017-09-28 ENCOUNTER — Ambulatory Visit (INDEPENDENT_AMBULATORY_CARE_PROVIDER_SITE_OTHER): Payer: Self-pay | Admitting: Orthopedic Surgery

## 2017-10-10 ENCOUNTER — Ambulatory Visit (INDEPENDENT_AMBULATORY_CARE_PROVIDER_SITE_OTHER): Payer: Self-pay | Admitting: Physician Assistant

## 2017-10-10 ENCOUNTER — Ambulatory Visit (INDEPENDENT_AMBULATORY_CARE_PROVIDER_SITE_OTHER): Payer: Self-pay

## 2017-10-10 DIAGNOSIS — S92354D Nondisplaced fracture of fifth metatarsal bone, right foot, subsequent encounter for fracture with routine healing: Secondary | ICD-10-CM

## 2017-10-10 DIAGNOSIS — M79671 Pain in right foot: Secondary | ICD-10-CM

## 2017-10-10 NOTE — Progress Notes (Signed)
   Office Visit Note   Patient: Japheth Bonniwell           Date of Birth: 1970-03-27           MRN: [id number] Visit Date: 10/10/2017              Requested by: No referring provider defined for this encounter. PCP: Patient, No Pcp Per  Chief Complaint  Patient presents with  . Right Foot - Follow-up      HPI: Patient is a 47 year old male who is seen for follow-up following a closed nondisplaced fracture of the fifth metatarsal.  He is now approximately 6 weeks post injury.  He had an inversion injury to the right foot.  He remains in a walker boot.  He has been maintaining nonweightbearing status and ambulating with crutches.  He reports that he has had some milder intermittent pain but it is overall much improved.  Assessment & Plan: Visit Diagnoses:  1. Right foot pain     Plan: Radiographs of the right foot are showing some callus formation but he is still having a little bit of discomfort were going to continue to maintain him in his cam walker boot and ambulating nonweightbearing with his crutches for another 3 weeks.  We will plan to see him back with follow-up x-rays at next visit.  Follow-Up Instructions: No follow-ups on file.   Ortho Exam  Patient is alert, oriented, no adenopathy, well-dressed, normal affect, normal respiratory effort. Examination of the right foot shows tenderness over the lateral foot to deep palpation over the metatarsal head.  There is no edema.  He has good pedal pulses.  He has good range of motion at the ankle.  Imaging: No results found. No images are attached to the encounter.  Labs: No results found for: HGBA1C, ESRSEDRATE, CRP, LABURIC, REPTSTATUS, GRAMSTAIN, CULT, LABORGA   No results found for: ALBUMIN, PREALBUMIN, LABURIC  There is no height or weight on file to calculate BMI.  Orders:  Orders Placed This Encounter  Procedures  . XR Foot 2 Views Right   No orders of the defined types were placed in this encounter.    Procedures: No procedures performed  Clinical Data: No additional findings.  ROS:  All other systems negative, except as noted in the HPI. Review of Systems  Objective: Vital Signs: There were no vitals taken for this visit.  Specialty Comments:  No specialty comments available.  PMFS History: There are no active problems to display for this patient.  No past medical history on file.  No family history on file.  No past surgical history on file. Social History   Occupational History  . Not on file  Tobacco Use  . Smoking status: Former Smoker    Packs/day: 0.50    Types: Cigarettes  . Smokeless tobacco: Never Used  Substance and Sexual Activity  . Alcohol use: Not Currently  . Drug use: No  . Sexual activity: Not on file

## 2017-10-11 ENCOUNTER — Encounter (INDEPENDENT_AMBULATORY_CARE_PROVIDER_SITE_OTHER): Payer: Self-pay | Admitting: Physician Assistant

## 2017-11-01 ENCOUNTER — Encounter (INDEPENDENT_AMBULATORY_CARE_PROVIDER_SITE_OTHER): Payer: Self-pay | Admitting: Orthopedic Surgery

## 2017-11-01 ENCOUNTER — Ambulatory Visit (INDEPENDENT_AMBULATORY_CARE_PROVIDER_SITE_OTHER): Payer: Self-pay | Admitting: Orthopedic Surgery

## 2017-11-01 ENCOUNTER — Ambulatory Visit (INDEPENDENT_AMBULATORY_CARE_PROVIDER_SITE_OTHER): Payer: Self-pay

## 2017-11-01 VITALS — Ht 74.0 in | Wt 225.0 lb

## 2017-11-01 DIAGNOSIS — S92354D Nondisplaced fracture of fifth metatarsal bone, right foot, subsequent encounter for fracture with routine healing: Secondary | ICD-10-CM

## 2017-11-03 NOTE — Progress Notes (Signed)
   Office Visit Note   Patient: Elijah Wood           Date of Birth: [birth year]           MRN: [id number] Visit Date: 11/01/2017              Requested by: No referring provider defined for this encounter. PCP: Patient, No Pcp Per  Chief Complaint  Patient presents with  . Right Ankle - Follow-up  . Right Foot - Follow-up      HPI: Patient is a 47 year old male who is seen in follow-up for nondisplaced fracture of the fifth metatarsal.  Initial injury was on July 17.  He has been nonweightbearing with crutches and a cam walker.  He reports he had continued nonweightbearing due to pain.  However has not had any pain With nonweightbearing.  Seems improved since last visit.  Assessment & Plan: Visit Diagnoses:  1. Closed nondisplaced fracture of fifth metatarsal bone of right foot with routine healing, subsequent encounter     Plan: He will begin full weightbearing in the cam walker.  Follow-up in 2 weeks.  If doing well clinically he can advance to weightbearing in regular shoewear.  Follow-Up Instructions: No follow-ups on file.   Ortho Exam  Patient is alert, oriented, no adenopathy, well-dressed, normal affect, normal respiratory effort. Examination of the right foot shows minimal tenderness over the fifth metatarsal base.  There is no edema.  He has good pedal pulses.  He has good range of motion at the ankle.  Imaging: No results found. No images are attached to the encounter.  Labs: No results found for: HGBA1C, ESRSEDRATE, CRP, LABURIC, REPTSTATUS, GRAMSTAIN, CULT, LABORGA   No results found for: ALBUMIN, PREALBUMIN, LABURIC  Body mass index is 28.89 kg/m.  Orders:  Orders Placed This Encounter  Procedures  . XR Foot Complete Right   No orders of the defined types were placed in this encounter.    Procedures: No procedures performed  Clinical Data: No additional findings.  ROS:  All other systems negative, except as noted in the HPI. Review of  Systems  Constitutional: Negative for chills and fever.  Musculoskeletal: Positive for arthralgias.    Objective: Vital Signs: Ht 6\' 2"  (1.88 m)   Wt 225 lb (102.1 kg)   BMI 28.89 kg/m   Specialty Comments:  No specialty comments available.  PMFS History: There are no active problems to display for this patient.  History reviewed. No pertinent past medical history.  History reviewed. No pertinent family history.  History reviewed. No pertinent surgical history. Social History   Occupational History  . Not on file  Tobacco Use  . Smoking status: Former Smoker    Packs/day: 0.50    Types: Cigarettes  . Smokeless tobacco: Never Used  Substance and Sexual Activity  . Alcohol use: Not Currently  . Drug use: No  . Sexual activity: Not on file

## 2017-11-15 ENCOUNTER — Ambulatory Visit (INDEPENDENT_AMBULATORY_CARE_PROVIDER_SITE_OTHER): Payer: Self-pay | Admitting: Orthopedic Surgery

## 2017-11-15 ENCOUNTER — Encounter (INDEPENDENT_AMBULATORY_CARE_PROVIDER_SITE_OTHER): Payer: Self-pay | Admitting: Orthopedic Surgery

## 2017-11-15 VITALS — Ht 74.0 in | Wt 225.0 lb

## 2017-11-15 DIAGNOSIS — S92354D Nondisplaced fracture of fifth metatarsal bone, right foot, subsequent encounter for fracture with routine healing: Secondary | ICD-10-CM

## 2017-11-15 NOTE — Progress Notes (Signed)
   Office Visit Note   Patient: Elijah Wood           Date of Birth: 11-29-1970           MRN: [id number] Visit Date: 11/15/2017              Requested by: No referring provider defined for this encounter. PCP: Patient, No Pcp Per  Chief Complaint  Patient presents with  . Right Foot - Follow-up      HPI: Patient is a 47 year old gentleman who presents in follow-up status post nondisplaced fracture fifth metatarsal right foot.  Patient states he ambulates in the fracture boot with minimal pain and states he is been able to ambulate without the boot on and also without pain around the house.  Patient states he feels that he is ready return to work.  Assessment & Plan: Visit Diagnoses:  1. Closed nondisplaced fracture of fifth metatarsal bone of right foot with routine healing, subsequent encounter     Plan: Patient is given a note to return to work without restrictions.  Recommended a stiff soled sneaker.  Follow-Up Instructions: Return if symptoms worsen or fail to improve.   Ortho Exam  Patient is alert, oriented, no adenopathy, well-dressed, normal affect, normal respiratory effort. Examination patient has an antalgic gait with the boot on.  Examination the fracture there is minimal tenderness to palpation.  Patient has good ankle good subtalar motion.  There is no swelling no edema.  Imaging: No results found. No images are attached to the encounter.  Labs: No results found for: HGBA1C, ESRSEDRATE, CRP, LABURIC, REPTSTATUS, GRAMSTAIN, CULT, LABORGA   No results found for: ALBUMIN, PREALBUMIN, LABURIC  Body mass index is 28.89 kg/m.  Orders:  No orders of the defined types were placed in this encounter.  No orders of the defined types were placed in this encounter.    Procedures: No procedures performed  Clinical Data: No additional findings.  ROS:  All other systems negative, except as noted in the HPI. Review of Systems  Objective: Vital Signs:  Ht 6\' 2"  (1.88 m)   Wt 225 lb (102.1 kg)   BMI 28.89 kg/m   Specialty Comments:  No specialty comments available.  PMFS History: There are no active problems to display for this patient.  History reviewed. No pertinent past medical history.  History reviewed. No pertinent family history.  History reviewed. No pertinent surgical history. Social History   Occupational History  . Not on file  Tobacco Use  . Smoking status: Former Smoker    Packs/day: 0.50    Types: Cigarettes  . Smokeless tobacco: Never Used  Substance and Sexual Activity  . Alcohol use: Not Currently  . Drug use: No  . Sexual activity: Not on file

## 2017-12-11 ENCOUNTER — Other Ambulatory Visit: Payer: Self-pay

## 2017-12-11 ENCOUNTER — Emergency Department (HOSPITAL_BASED_OUTPATIENT_CLINIC_OR_DEPARTMENT_OTHER)
Admission: EM | Admit: 2017-12-11 | Discharge: 2017-12-11 | Disposition: A | Discharge status: Home / Self Care | Payer: Self-pay | Attending: Emergency Medicine | Admitting: Emergency Medicine

## 2017-12-11 ENCOUNTER — Encounter (HOSPITAL_BASED_OUTPATIENT_CLINIC_OR_DEPARTMENT_OTHER): Payer: Self-pay

## 2017-12-11 ENCOUNTER — Emergency Department (HOSPITAL_BASED_OUTPATIENT_CLINIC_OR_DEPARTMENT_OTHER): Payer: Self-pay

## 2017-12-11 DIAGNOSIS — F17210 Nicotine dependence, cigarettes, uncomplicated: Secondary | ICD-10-CM | POA: Insufficient documentation

## 2017-12-11 DIAGNOSIS — J189 Pneumonia, unspecified organism: Secondary | ICD-10-CM | POA: Insufficient documentation

## 2017-12-11 LAB — CBC WITH DIFFERENTIAL/PLATELET
Abs Immature Granulocytes: 0.04 10*3/uL (ref 0.00–0.07)
BASOS ABS: 0 10*3/uL (ref 0.0–0.1)
Basophils Relative: 0 %
Eosinophils Absolute: 0 10*3/uL (ref 0.0–0.5)
Eosinophils Relative: 0 %
HEMATOCRIT: 39.2 % (ref 39.0–52.0)
Hemoglobin: 12.4 g/dL — ABNORMAL LOW (ref 13.0–17.0)
IMMATURE GRANULOCYTES: 0 %
LYMPHS ABS: 1.2 10*3/uL (ref 0.7–4.0)
Lymphocytes Relative: 14 %
MCH: 28.8 pg (ref 26.0–34.0)
MCHC: 31.6 g/dL (ref 30.0–36.0)
MCV: 91.2 fL (ref 80.0–100.0)
Monocytes Absolute: 0.8 10*3/uL (ref 0.1–1.0)
Monocytes Relative: 9 %
NEUTROS PCT: 77 %
NRBC: 0 % (ref 0.0–0.2)
Neutro Abs: 6.9 10*3/uL (ref 1.7–7.7)
PLATELETS: 293 10*3/uL (ref 150–400)
RBC: 4.3 MIL/uL (ref 4.22–5.81)
RDW: 13 % (ref 11.5–15.5)
WBC: 9 10*3/uL (ref 4.0–10.5)

## 2017-12-11 LAB — BASIC METABOLIC PANEL
Anion gap: 9 (ref 5–15)
BUN: 14 mg/dL (ref 6–20)
CALCIUM: 8.6 mg/dL — AB (ref 8.9–10.3)
CO2: 23 mmol/L (ref 22–32)
CREATININE: 1.19 mg/dL (ref 0.61–1.24)
Chloride: 101 mmol/L (ref 98–111)
GFR calc non Af Amer: >60 mL/min (ref >60)
Glucose, Bld: 104 mg/dL — ABNORMAL HIGH (ref 70–99)
Potassium: 3.5 mmol/L (ref 3.5–5.1)
SODIUM: 133 mmol/L — AB (ref 135–145)

## 2017-12-11 MED ORDER — DOXYCYCLINE HYCLATE 100 MG PO CAPS: 100.0000 mg | ORAL_CAPSULE | Freq: Two times a day (BID) | ORAL | 0 refills | Status: AC

## 2017-12-11 MED ORDER — IOPAMIDOL (ISOVUE-300) INJECTION 61%
80.0000 mL | Freq: Once | INTRAVENOUS | Status: AC | PRN
  Administered 2017-12-11: 80 mL via INTRAVENOUS

## 2017-12-11 MED ORDER — LACTATED RINGERS IV BOLUS
1000.0000 mL | Freq: Once | INTRAVENOUS | Status: AC
  Administered 2017-12-11: 1000 mL via INTRAVENOUS

## 2017-12-11 MED ORDER — ACETAMINOPHEN 325 MG PO TABS
650.0000 mg | ORAL_TABLET | Freq: Once | ORAL | Status: AC | PRN
  Administered 2017-12-11: 650 mg via ORAL
  Filled 2017-12-11: qty 2

## 2017-12-11 NOTE — ED Triage Notes (Addendum)
Pt c/o URI symptoms, productive cough, chills, body aches x 2 days. Pt also reports some ShOB.

## 2017-12-11 NOTE — ED Notes (Signed)
C/o head pressure, runny nose, body aches productive cough x 2 days

## 2017-12-11 NOTE — ED Provider Notes (Signed)
[facility] EMERGENCY DEPARTMENT Provider Note   CSN: [id number] Arrival date & time: 12/11/17  1903     History   Chief Complaint Chief Complaint  Patient presents with  . Influenza    HPI Elijah Wood is a 47 y.o. male.  The history is provided by the patient.  Fever   This is a new problem. The current episode started yesterday. The problem occurs constantly. The problem has not changed since onset.The maximum temperature noted was 100 to 100.9 F. Associated symptoms include muscle aches and cough. Pertinent negatives include no chest pain, no fussiness, no sleepiness, no diarrhea, no vomiting, no congestion and no sore throat. He has tried nothing for the symptoms. The treatment provided no relief.    History reviewed. No pertinent past medical history.  There are no active problems to display for this patient.   History reviewed. No pertinent surgical history.      Home Medications    Prior to Admission medications   Medication Sig Start Date End Date Taking? Authorizing Provider  doxycycline (VIBRAMYCIN) 100 MG capsule Take 1 capsule (100 mg total) by mouth 2 (two) times daily for 10 days. 12/11/17 12/21/17  Ricci Paff, DO  HYDROcodone-acetaminophen (NORCO) 5-325 MG tablet Take 1 tablet by mouth every 6 (six) hours as needed (for pain). Patient not taking: Reported on 11/01/2017 08/23/17   Molpus, Jonny Ruiz, MD    Family History No family history on file.  Social History Social History   Tobacco Use  . Smoking status: Current Some Day Smoker    Packs/day: 0.50    Types: Cigarettes  . Smokeless tobacco: Never Used  Substance Use Topics  . Alcohol use: Not Currently  . Drug use: No     Allergies   Patient has no known allergies.   Review of Systems Review of Systems  Constitutional: Positive for fever. Negative for chills.  HENT: Negative for congestion, ear pain and sore throat.   Eyes: Negative for pain and visual disturbance.    Respiratory: Positive for cough. Negative for shortness of breath.   Cardiovascular: Negative for chest pain and palpitations.  Gastrointestinal: Negative for abdominal pain, diarrhea and vomiting.  Genitourinary: Negative for dysuria and hematuria.  Musculoskeletal: Positive for myalgias. Negative for arthralgias and back pain.  Skin: Negative for color change and rash.  Neurological: Negative for seizures and syncope.  All other systems reviewed and are negative.    Physical Exam Updated Vital Signs  ED Triage Vitals  Enc Vitals Group     BP 12/11/17 1914 137/86     Pulse Rate 12/11/17 1914 (!) 101     Resp 12/11/17 1914 17     Temp 12/11/17 1914 (!) 101 F (38.3 C)     Temp Source 12/11/17 2200 Oral     SpO2 12/11/17 1914 98 %     Weight 12/11/17 1912 225 lb (102.1 kg)     Height 12/11/17 1912 6\' 2"  (1.88 m)     Head Circumference --      Peak Flow --      Pain Score 12/11/17 1912 10     Pain Loc --      Pain Edu? --      Excl. in GC? --     Physical Exam  Constitutional: He is oriented to person, place, and time. He appears well-developed and well-nourished.  HENT:  Head: Normocephalic and atraumatic.  Eyes: Pupils are equal, round, and reactive to light. Conjunctivae and EOM are  normal.  Neck: Normal range of motion. Neck supple.  Cardiovascular: Normal rate, regular rhythm, normal heart sounds and intact distal pulses.  No murmur heard. Pulmonary/Chest: Effort normal. No respiratory distress. He has no wheezes.  Coarse breath sounds throughout  Abdominal: Soft. Bowel sounds are normal. He exhibits no distension. There is no tenderness.  Musculoskeletal: Normal range of motion. He exhibits no edema.  Neurological: He is alert and oriented to person, place, and time.  Skin: Skin is warm and dry. Capillary refill takes less than 2 seconds.  Psychiatric: He has a normal mood and affect.  Nursing note and vitals reviewed.    ED Treatments / Results  Labs (all  labs ordered are listed, but only abnormal results are displayed) Labs Reviewed  CBC WITH DIFFERENTIAL/PLATELET - Abnormal; Notable for the following components:      Result Value   Hemoglobin 12.4 (*)    All other components within normal limits  BASIC METABOLIC PANEL - Abnormal; Notable for the following components:   Sodium 133 (*)    Glucose, Bld 104 (*)    Calcium 8.6 (*)    All other components within normal limits    EKG None  Radiology Dg Chest 2 View  Result Date: 12/11/2017 CLINICAL DATA:  Shortness of breath, productive cough, chills, and body aches for 2 days. EXAM: CHEST - 2 VIEW COMPARISON:  07/05/2016 FINDINGS: The cardiomediastinal silhouette is unchanged with normal heart size. There is a new 2 cm rounded density projecting over the anterior mediastinum on the lateral radiograph without a definite correlate seen on the PA radiograph. The lungs are clear on the PA radiograph. No pleural effusion or pneumothorax is identified. No acute osseous abnormality is seen. IMPRESSION: 1. 2 cm rounded density projecting over the anterior mediastinum on the lateral radiograph without definite correlate on the PA radiograph. Chest CT is recommended to further evaluate for a mediastinal mass or lung nodule. 2. Otherwise unchanged and negative examination. Electronically Signed   By: Sebastian Ache M.D.   On: 12/11/2017 20:20   Ct Chest W Contrast  Result Date: 12/11/2017 CLINICAL DATA:  Upper respiratory infection symptoms, shortness of breath, productive cough, chills, and body aches for 2 days. Chest x-ray demonstrated rounded densities over the anterior mediastinum on the lateral view. EXAM: CT CHEST WITH CONTRAST TECHNIQUE: Multidetector CT imaging of the chest was performed during intravenous contrast administration. CONTRAST:  80mL ISOVUE-300 IOPAMIDOL (ISOVUE-300) INJECTION 61% COMPARISON:  Chest radiograph 12/11/2017 FINDINGS: Cardiovascular: Normal heart size. No pericardial effusion.  Normal caliber thoracic aorta. No aneurysm or dissection. Great vessel origins are patent. Mediastinum/Nodes: Diffuse enlargement of the thyroid gland without significant nodularity. Scattered mediastinal lymph nodes are not pathologically enlarged. No mediastinal mass. Esophagus is decompressed. Lungs/Pleura: Multifocal areas of nodular consolidation demonstrated throughout both lungs, most prominent in the medial left lower lung and the medial right middle lung. Some nodular peribronchovascular tree-in-bud densities with some bronchial wall thickening. Changes likely to represent multifocal bronchopneumonia. Three-month follow-up suggested to exclude underlying pulmonary nodules. No pleural effusions. No pneumothorax. Upper Abdomen: No acute abnormalities identified. Musculoskeletal: No chest wall abnormality. No acute or significant osseous findings. IMPRESSION: Multifocal areas of nodular consolidation throughout both lungs, most prominent in the medial left lower lung and medial right middle lung. Changes likely to represent multifocal bronchopneumonia. Three-month follow-up suggested to exclude underlying pulmonary nodules. Electronically Signed   By: Burman Nieves M.D.   On: 12/11/2017 21:39    Procedures Procedures (including critical care time)  Medications Ordered in ED Medications  acetaminophen (TYLENOL) tablet 650 mg (650 mg Oral Given 12/11/17 1918)  lactated ringers bolus 1,000 mL (0 mLs Intravenous Stopped 12/11/17 2156)  iopamidol (ISOVUE-300) 61 % injection 80 mL (80 mLs Intravenous Contrast Given 12/11/17 2112)     Initial Impression / Assessment and Plan / ED Course  I have reviewed the triage vital signs and the nursing notes.  Pertinent labs & imaging results that were available during my care of the patient were reviewed by me and considered in my medical decision making (see chart for details).     Elizeo Deman is a 47 year old male who presents to the ED with viral  type syndrome.  Patient with fever, mild tachycardia upon arrival but otherwise normal vitals.  Patient states that he has had body aches, runny nose, cough for the last 2 days.  Has no significant medical history.  Overall is well-appearing.  Has coarse breath sounds on exam.  No signs of volume overload.  No hypoxia.  Breathing comfortably.  Patient states that he has had symptoms for the past 2 days.  Noticed that he had a fever but has not taken any medication.  Patient was given Tylenol upon arrival.  Chest x-ray with questionable pulmonary nodule and recommended CT scan.  CT scan was done that showed possible multifocal bronchopneumonia.  Patient otherwise no significant leukocytosis, anemia, electrolyte abnormality.  Patient is overall well-appearing, no hypoxia.  No concern for sepsis.  Given prescription for doxycycline and recommend close follow-up with primary care doctor.  Told her return to the ED if symptoms worsen.  Discharged from ED in good condition.  Understands to follow-up for CT scan in the next several months as this could be underlying pulmonary nodules.  Final Clinical Impressions(s) / ED Diagnoses   Final diagnoses:  Community acquired pneumonia, unspecified laterality    ED Discharge Orders         Ordered    doxycycline (VIBRAMYCIN) 100 MG capsule  2 times daily     12/11/17 2205           Virgina Norfolk, DO 12/11/17 2317

## 2017-12-13 ENCOUNTER — Ambulatory Visit (INDEPENDENT_AMBULATORY_CARE_PROVIDER_SITE_OTHER): Payer: Self-pay | Admitting: Family

## 2018-01-15 ENCOUNTER — Ambulatory Visit (INDEPENDENT_AMBULATORY_CARE_PROVIDER_SITE_OTHER): Payer: Self-pay | Admitting: Physician Assistant

## 2018-10-25 ENCOUNTER — Encounter (HOSPITAL_BASED_OUTPATIENT_CLINIC_OR_DEPARTMENT_OTHER): Payer: Self-pay | Admitting: *Deleted

## 2018-10-25 ENCOUNTER — Emergency Department (HOSPITAL_BASED_OUTPATIENT_CLINIC_OR_DEPARTMENT_OTHER)
Admission: EM | Admit: 2018-10-25 | Discharge: 2018-10-25 | Disposition: A | Discharge status: Home / Self Care | Payer: Commercial Managed Care - PPO | Attending: Emergency Medicine | Admitting: Emergency Medicine

## 2018-10-25 ENCOUNTER — Other Ambulatory Visit: Payer: Self-pay

## 2018-10-25 DIAGNOSIS — A499 Bacterial infection, unspecified: Secondary | ICD-10-CM | POA: Insufficient documentation

## 2018-10-25 DIAGNOSIS — H11433 Conjunctival hyperemia, bilateral: Secondary | ICD-10-CM | POA: Diagnosis present

## 2018-10-25 DIAGNOSIS — F17210 Nicotine dependence, cigarettes, uncomplicated: Secondary | ICD-10-CM | POA: Insufficient documentation

## 2018-10-25 DIAGNOSIS — Z79899 Other long term (current) drug therapy: Secondary | ICD-10-CM | POA: Diagnosis not present

## 2018-10-25 DIAGNOSIS — H1033 Unspecified acute conjunctivitis, bilateral: Secondary | ICD-10-CM | POA: Diagnosis not present

## 2018-10-25 MED ORDER — MOXIFLOXACIN HCL 0.5 % OP SOLN
1.0000 [drp] | Freq: Three times a day (TID) | OPHTHALMIC | 0 refills | Status: AC
Start: 2018-10-25 — End: ?

## 2018-10-25 MED FILL — MOXIFLOXACIN HCL 0.5 % SOLN: 0.5 | 15 days supply | Qty: 3 | Fill #0

## 2018-10-25 NOTE — ED Provider Notes (Signed)
[facility] EMERGENCY DEPARTMENT Provider Note   CSN: [id number] Arrival date & time: 10/25/18  1521     History   Chief Complaint Chief Complaint  Patient presents with  . Eye Problem    HPI Elijah Wood is a 48 y.o. male.     The history is provided by the patient and medical records. No language interpreter was used.  Eye Problem Associated symptoms: discharge, itching and redness   Associated symptoms: no photophobia    Elijah Wood is a 48 y.o. male who presents to the Emergency Department complaining of bilateral eye redness for the last 3 days.  Started just the right eye, but yesterday, left eye became red and itchy as well.  No visual changes.  A coworker was taken out of work due to [employer] since last Friday.  Denies nasal congestion, rhinorrhea, cough, fevers or any other symptoms.  Denies foreign body sensation in the eyes.  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  History reviewed. No pertinent past medical history.  There are no active problems to display for this patient.   History reviewed. No pertinent surgical history.      Home Medications    Prior to Admission medications   Medication Sig Start Date End Date Taking? Authorizing Provider  HYDROcodone-acetaminophen (NORCO) 5-325 MG tablet Take 1 tablet by mouth every 6 (six) hours as needed (for pain). Patient not taking: Reported on 11/01/2017 08/23/17   Molpus, Jenny Reichmann, MD  moxifloxacin (VIGAMOX) 0.5 % ophthalmic solution Place 1 drop into both eyes 3 (three) times daily. 10/25/18   Ward, Ozella Almond, PA-C    Family History No family history on file.  Social History Social History   Tobacco Use  . Smoking status: Current Some Day Smoker    Packs/day: 0.50    Types: Cigarettes  . Smokeless tobacco: Never Used  Substance Use Topics  . Alcohol use: Not Currently  . Drug use: No     Allergies   Patient has no known  allergies.   Review of Systems Review of Systems  Constitutional: Negative for chills and fever.  HENT: Negative for congestion, ear pain and sore throat.   Eyes: Positive for pain, discharge, redness and itching. Negative for photophobia and visual disturbance.  Respiratory: Negative for cough and shortness of breath.      Physical Exam Updated Vital Signs BP (!) 142/95   Pulse 60   Temp 98.4 F (36.9 C)   Resp 18   Ht 6\' 2"  (1.88 m)   Wt 102.1 kg   SpO2 100%   BMI 28.89 kg/m   Physical Exam Vitals signs and nursing note reviewed.  Constitutional:      General: He is not in acute distress.    Appearance: He is well-developed.  HENT:     Head: Normocephalic and atraumatic.  Eyes:     General: Lids are normal.     Extraocular Movements: Extraocular movements intact.     Conjunctiva/sclera:     Right eye: Right conjunctiva is injected.     Left eye: Left conjunctiva is injected.  Neck:     Musculoskeletal: Neck supple.  Cardiovascular:     Rate and Rhythm: Normal rate and regular rhythm.     Heart sounds: Normal heart sounds. No murmur.  Pulmonary:     Effort: Pulmonary effort is normal. No respiratory distress.     Breath sounds: Normal breath sounds.  No wheezing or rales.  Musculoskeletal: Normal range of motion.  Skin:    General: Skin is warm and dry.  Neurological:     Mental Status: He is alert.      ED Treatments / Results  Labs (all labs ordered are listed, but only abnormal results are displayed) Labs Reviewed - No data to display  EKG None  Radiology No results found.  Procedures Procedures (including critical care time)  Medications Ordered in ED Medications - No data to display   Initial Impression / Assessment and Plan / ED Course  I have reviewed the triage vital signs and the nursing notes.  Pertinent labs & imaging results that were available during my care of the patient were reviewed by me and considered in my medical  decision making (see chart for details).       Elijah Wood is a 48 y.o. male who presents to ED for bilateral eye redness and itching for the last 3 days.  Started with 1 eye and then moved to the other.  Someone at work recently taken out for pinkeye.  His exam is consistent with pinkeye as well.  Shared decision making about doing fluorescein stain to assess for corneal abrasion.  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 He does not feel this is necessary.  We will treat for conjunctivitis with Vigamox.  Ortho follow-up if no improvement.  Reasons to return to the ER were discussed and all questions answered.   Final Clinical Impressions(s) / ED Diagnoses   Final diagnoses:  Acute bacterial conjunctivitis of both eyes    ED Discharge Orders         Ordered    moxifloxacin (VIGAMOX) 0.5 % ophthalmic solution  3 times daily    Note to Pharmacy: Can sub for Polytrim q4h if this is not available at pharmacy or too expensive.   10/25/18 1543           Ward, Chase PicketJaime Pilcher, PA-C 10/25/18 1609    Tilden Fossaees, Elizabeth, MD 10/25/18 2355

## 2018-10-25 NOTE — ED Triage Notes (Signed)
Pt c/o eye redness drainage x 4 days

## 2018-10-25 NOTE — Discharge Instructions (Addendum)
It was my pleasure taking care of you today!   Use antibiotic eyedrops as directed.  If no improvement or worsening, see the eye doctor.  Return to ER for new or worsening symptoms, any additional concerns.

## 2019-02-15 ENCOUNTER — Ambulatory Visit: Payer: Commercial Managed Care - PPO | Attending: Internal Medicine

## 2019-02-15 DIAGNOSIS — Z20822 Contact with and (suspected) exposure to covid-19: Secondary | ICD-10-CM

## 2019-02-16 LAB — NOVEL CORONAVIRUS, NAA: SARS-CoV-2, NAA: DETECTED — AB

## 2020-09-04 ENCOUNTER — Other Ambulatory Visit: Payer: Self-pay

## 2020-09-04 ENCOUNTER — Encounter (HOSPITAL_BASED_OUTPATIENT_CLINIC_OR_DEPARTMENT_OTHER): Payer: Self-pay | Admitting: Emergency Medicine

## 2020-09-04 ENCOUNTER — Emergency Department (HOSPITAL_BASED_OUTPATIENT_CLINIC_OR_DEPARTMENT_OTHER)
Admission: EM | Admit: 2020-09-04 | Discharge: 2020-09-04 | Disposition: A | Discharge status: Home / Self Care | Payer: Worker's Compensation | Attending: Emergency Medicine | Admitting: Emergency Medicine

## 2020-09-04 ENCOUNTER — Other Ambulatory Visit (HOSPITAL_BASED_OUTPATIENT_CLINIC_OR_DEPARTMENT_OTHER): Payer: Self-pay

## 2020-09-04 DIAGNOSIS — Z23 Encounter for immunization: Secondary | ICD-10-CM | POA: Insufficient documentation

## 2020-09-04 DIAGNOSIS — H5711 Ocular pain, right eye: Secondary | ICD-10-CM | POA: Diagnosis not present

## 2020-09-04 DIAGNOSIS — H5789 Other specified disorders of eye and adnexa: Secondary | ICD-10-CM

## 2020-09-04 DIAGNOSIS — F17210 Nicotine dependence, cigarettes, uncomplicated: Secondary | ICD-10-CM | POA: Insufficient documentation

## 2020-09-04 DIAGNOSIS — Y990 Civilian activity done for income or pay: Secondary | ICD-10-CM | POA: Diagnosis not present

## 2020-09-04 MED ORDER — TETANUS-DIPHTH-ACELL PERTUSSIS 5-2.5-18.5 LF-MCG/0.5 IM SUSY
0.5000 mL | PREFILLED_SYRINGE | Freq: Once | INTRAMUSCULAR | Status: AC
  Administered 2020-09-04: 0.5 mL via INTRAMUSCULAR
  Filled 2020-09-04: qty 0.5

## 2020-09-04 MED ORDER — TETRACAINE HCL 0.5 % OP SOLN
OPHTHALMIC | Status: AC
  Administered 2020-09-04: 1 [drp] via OPHTHALMIC
  Filled 2020-09-04: qty 4

## 2020-09-04 MED ORDER — TETRACAINE HCL 0.5 % OP SOLN
1.0000 [drp] | Freq: Once | OPHTHALMIC | Status: AC
  Filled 2020-09-04: qty 4

## 2020-09-04 MED ORDER — ERYTHROMYCIN 5 MG/GM OP OINT
TOPICAL_OINTMENT | Freq: Four times a day (QID) | OPHTHALMIC | Status: DC
  Administered 2020-09-04: 1 via OPHTHALMIC
  Filled 2020-09-04: qty 3.5

## 2020-09-04 MED ORDER — FLUORESCEIN SODIUM 1 MG OP STRP
ORAL_STRIP | OPHTHALMIC | Status: AC
  Filled 2020-09-04: qty 1

## 2020-09-04 MED ORDER — OXYCODONE HCL 5 MG PO TABS
5.0000 mg | ORAL_TABLET | Freq: Four times a day (QID) | ORAL | 0 refills | Status: DC | PRN
  Filled 2020-09-04: qty 10, 3d supply, fill #0

## 2020-09-04 NOTE — Discharge Instructions (Signed)
Use antibiotic ointment 4 times a day.  Continue to flush out eye with cold water every several hours today and tomorrow as needed.  Follow-up with ophthalmology.  Recommend Tylenol and ibuprofen for pain.  Take Roxicodone for any breakthrough pain.

## 2020-09-04 NOTE — ED Triage Notes (Addendum)
Pt reports benzyl alcohol splashed in his RT eye at 10am; was rinsed for 30 min

## 2020-09-04 NOTE — ED Provider Notes (Signed)
[facility] EMERGENCY DEPARTMENT Provider Note   CSN: [id number] Arrival date & time: 09/04/20  1136     History Chief Complaint  Patient presents with   Chemical Exposure    Elijah Wood is a 50 y.o. male.  Patient had benzyl alcohol splashed in his eye while at work today.  Was wearing safety goggles.  Was cleaning work IT sales professional.  Flush his eye out at eye station for 30 minutes.  The history is provided by the patient.  Eye Pain This is a new problem. The current episode started 1 to 2 hours ago. The problem has been gradually improving. Nothing aggravates the symptoms. Nothing relieves the symptoms. He has tried nothing for the symptoms. The treatment provided no relief.      History reviewed. No pertinent past medical history.  There are no problems to display for this patient.   History reviewed. No pertinent surgical history.     No family history on file.  Social History   Tobacco Use   Smoking status: Some Days    Packs/day: 0.50    Types: Cigarettes   Smokeless tobacco: Never  Vaping Use   Vaping Use: Never used  Substance Use Topics   Alcohol use: Not Currently   Drug use: No    Home Medications Prior to Admission medications   Medication Sig Start Date End Date Taking? Authorizing Provider  oxyCODONE (ROXICODONE) 5 MG immediate release tablet Take 1 tablet (5 mg total) by mouth every 6 (six) hours as needed for breakthrough pain. 09/04/20  Yes Naylin Burkle, DO  HYDROcodone-acetaminophen (NORCO) 5-325 MG tablet Take 1 tablet by mouth every 6 (six) hours as needed (for pain). Patient not taking: Reported on 11/01/2017 08/23/17   Molpus, Jonny Ruiz, MD  moxifloxacin (VIGAMOX) 0.5 % ophthalmic solution Place 1 drop into both eyes 3 (three) times daily. 10/25/18   Ward, Chase Picket, PA-C    Allergies    Patient has no known allergies.  Review of Systems   Review of Systems  Constitutional:  Negative for fever.  Eyes:  Positive for pain and  itching. Negative for photophobia, discharge, redness and visual disturbance.   Physical Exam Updated Vital Signs  ED Triage Vitals  Enc Vitals Group     BP 09/04/20 1159 (!) 135/103     Pulse Rate 09/04/20 1159 61     Resp 09/04/20 1159 18     Temp 09/04/20 1159 98.3 F (36.8 C)     Temp Source 09/04/20 1159 Oral     SpO2 09/04/20 1159 100 %     Weight 09/04/20 1153 213 lb (96.6 kg)     Height 09/04/20 1153 6\' 2"  (1.88 m)     Head Circumference --      Peak Flow --      Pain Score 09/04/20 1153 7     Pain Loc --      Pain Edu? --      Excl. in GC? --      Physical Exam Constitutional:      General: He is not in acute distress.    Appearance: He is not ill-appearing.  HENT:     Head: Normocephalic and atraumatic.     Nose: Nose normal.     Mouth/Throat:     Mouth: Mucous membranes are moist.  Eyes:     Extraocular Movements: Extraocular movements intact.     Pupils: Pupils are equal, round, and reactive to light.     Comments: Conjunctiva is  red and irritated on the right, normal on the left eye, no obvious corneal abrasion and normal eye pressure in the right eye, pH appears to be about 7 or 8  Neurological:     Mental Status: He is alert.    ED Results / Procedures / Treatments   Labs (all labs ordered are listed, but only abnormal results are displayed) Labs Reviewed - No data to display  EKG None  Radiology No results found.  Procedures Procedures   Medications Ordered in ED Medications  erythromycin ophthalmic ointment (has no administration in time range)  tetracaine (PONTOCAINE) 0.5 % ophthalmic solution 1 drop (1 drop Both Eyes Given 09/04/20 1232)  fluorescein 1 MG ophthalmic strip (  Given 09/04/20 1232)  Tdap (BOOSTRIX) injection 0.5 mL (0.5 mLs Intramuscular Given 09/04/20 1234)    ED Course  I have reviewed the triage vital signs and the nursing notes.  Pertinent labs & imaging results that were available during my care of the patient were  reviewed by me and considered in my medical decision making (see chart for details).    MDM Rules/Calculators/A&P                           Elijah Wood is here with right eye pain after exposure to benzyl alcohol at work.  Was wearing safety goggles.  Pens alcohol splashed in his eye.  Flushed his eye out for 30 minutes at eye station at work.  Here for evaluation.  Eye exam overall appears to be unremarkable.  He has some conjunctiva redness in the right eye but no obvious corneal abrasion or ulceration.  Visual acuity is intact, eye pressure is normal.  pH appears to be between 7 and 8 but will flush his eye with a liter of fluid and reevaluate pH.  pH appears to be stable between 7 and 8.  Will prescribe erythromycin eye ointment and have him follow-up with ophthalmology.  Discharged in good condition.  Understands return precautions.  This chart was dictated using voice recognition software.  Despite best efforts to proofread,  errors can occur which can change the documentation meaning.  Final Clinical Impression(s) / ED Diagnoses Final diagnoses:  Irritation of right eye    Rx / DC Orders ED Discharge Orders          Ordered    oxyCODONE (ROXICODONE) 5 MG immediate release tablet  Every 6 hours PRN        09/04/20 1228             Virgina Norfolk, DO 09/04/20 1300

## 2020-09-14 ENCOUNTER — Other Ambulatory Visit (HOSPITAL_BASED_OUTPATIENT_CLINIC_OR_DEPARTMENT_OTHER): Payer: Self-pay

## 2021-03-20 ENCOUNTER — Emergency Department (HOSPITAL_BASED_OUTPATIENT_CLINIC_OR_DEPARTMENT_OTHER)
Admission: EM | Admit: 2021-03-20 | Discharge: 2021-03-20 | Disposition: A | Discharge status: Home / Self Care | Payer: PRIVATE HEALTH INSURANCE | Attending: Emergency Medicine | Admitting: Emergency Medicine

## 2021-03-20 ENCOUNTER — Other Ambulatory Visit: Payer: Self-pay

## 2021-03-20 ENCOUNTER — Encounter (HOSPITAL_BASED_OUTPATIENT_CLINIC_OR_DEPARTMENT_OTHER): Payer: Self-pay

## 2021-03-20 DIAGNOSIS — F17200 Nicotine dependence, unspecified, uncomplicated: Secondary | ICD-10-CM | POA: Diagnosis not present

## 2021-03-20 DIAGNOSIS — K029 Dental caries, unspecified: Secondary | ICD-10-CM | POA: Insufficient documentation

## 2021-03-20 DIAGNOSIS — K0889 Other specified disorders of teeth and supporting structures: Secondary | ICD-10-CM | POA: Diagnosis present

## 2021-03-20 DIAGNOSIS — K0381 Cracked tooth: Secondary | ICD-10-CM | POA: Insufficient documentation

## 2021-03-20 MED ORDER — PENICILLIN V POTASSIUM 500 MG PO TABS: 500.0000 mg | ORAL_TABLET | Freq: Four times a day (QID) | ORAL | 0 refills | Status: AC

## 2021-03-20 NOTE — ED Notes (Signed)
Pt in bed, pt has swelling to L face, pt has broken and missing teeth on the L side, pt states that he has had an abscess before.  Resps even and unlabored.

## 2021-03-20 NOTE — ED Triage Notes (Signed)
Pt to er, pt states that he is here for some facial swelling, states that he doesn't have a dentist he sees, but he needs to.  Pt states that the swelling started about two days ago. Pt states that he has had a similar episode in the past.  Pt reports some dental problems.

## 2021-03-20 NOTE — ED Provider Notes (Signed)
[facility] EMERGENCY DEPARTMENT Provider Note   CSN: [id number] Arrival date & time: 03/20/21  0703     History  Chief Complaint  Patient presents with   Dental Pain    Elijah Wood is a 50 y.o. male.  He has no significant past medical history.  Complaining of dental pain and swelling of his face that started yesterday.  Prior history of poor dentition.  Denies any fevers nausea vomiting.   The history is provided by the patient.  Dental Pain Location:  Upper Upper teeth location:  14/LU 1st molar and 15/LU 2nd molar Quality:  Aching Severity:  Moderate Onset quality:  Gradual Duration:  2 days Timing:  Constant Progression:  Worsening Chronicity:  Recurrent Context: abscess and dental fracture   Relieved by:  None tried Worsened by:  Cold food/drink, hot food/drink and touching Ineffective treatments:  None tried Associated symptoms: facial pain, facial swelling and gum swelling   Associated symptoms: no difficulty swallowing, no drooling, no fever, no neck swelling and no trismus   Risk factors: lack of dental care and smoking       Home Medications Prior to Admission medications   Medication Sig Start Date End Date Taking? Authorizing Provider  HYDROcodone-acetaminophen (NORCO) 5-325 MG tablet Take 1 tablet by mouth every 6 (six) hours as needed (for pain). Patient not taking: Reported on 11/01/2017 08/23/17   Molpus, Jonny Ruiz, MD  moxifloxacin (VIGAMOX) 0.5 % ophthalmic solution Place 1 drop into both eyes 3 (three) times daily. 10/25/18   Ward, Chase Picket, PA-C  oxyCODONE (ROXICODONE) 5 MG immediate release tablet Take 1 tablet (5 mg total) by mouth every 6 (six) hours as needed for breakthrough pain. 09/04/20   Virgina Norfolk, DO      Allergies    Patient has no known allergies.    Review of Systems   Review of Systems  Constitutional:  Negative for fever.  HENT:  Positive for facial swelling. Negative for drooling and trouble swallowing.     Physical Exam Updated Vital Signs BP (!) 150/98 (BP Location: Right Arm)    Pulse 71    Temp 97.7 F (36.5 C) (Oral)    Resp 18    Ht 6\' 2"  (1.88 m)    Wt 99.8 kg    SpO2 100%    BMI 28.25 kg/m  Physical Exam Vitals and nursing note reviewed.  Constitutional:      General: He is not in acute distress.    Appearance: Normal appearance. He is well-developed.  HENT:     Head: Normocephalic and atraumatic.     Mouth/Throat:     Mouth: Mucous membranes are moist.     Pharynx: Oropharynx is clear.     Comments: He has multiple fractured teeth on his upper left molars.  Some facial tenderness and facial swelling in that area.  No fluctuance.  No trismus. Eyes:     Conjunctiva/sclera: Conjunctivae normal.  Cardiovascular:     Rate and Rhythm: Normal rate and regular rhythm.     Heart sounds: No murmur heard. Pulmonary:     Effort: Pulmonary effort is normal. No respiratory distress.     Breath sounds: Normal breath sounds.  Abdominal:     Palpations: Abdomen is soft.     Tenderness: There is no abdominal tenderness.  Musculoskeletal:        General: No swelling.     Cervical back: Neck supple.  Skin:    General: Skin is warm and dry.  Neurological:     General: No focal deficit present.     Mental Status: He is alert.    ED Results / Procedures / Treatments   Labs (all labs ordered are listed, but only abnormal results are displayed) Labs Reviewed - No data to display  EKG None  Radiology No results found.  Procedures Procedures    Medications Ordered in ED Medications - No data to display  ED Course/ Medical Decision Making/ A&P                           Medical Decision Making Risk Prescription drug management.  51 year old male complaining of atraumatic facial swelling and pain.  Poor dentition.  No clear fluctuance for I&D.  Will cover with antibiotics.  Given contact information for dentistry.  Return instructions discussed         Final  Clinical Impression(s) / ED Diagnoses Final diagnoses:  Pain due to dental caries    Rx / DC Orders ED Discharge Orders          Ordered    penicillin v potassium (VEETID) 500 MG tablet  4 times daily        03/20/21 0725              Terrilee Files, MD 03/20/21 1746

## 2021-07-11 ENCOUNTER — Emergency Department (HOSPITAL_BASED_OUTPATIENT_CLINIC_OR_DEPARTMENT_OTHER): Payer: PRIVATE HEALTH INSURANCE

## 2021-07-11 ENCOUNTER — Encounter (HOSPITAL_BASED_OUTPATIENT_CLINIC_OR_DEPARTMENT_OTHER): Payer: Self-pay | Admitting: Emergency Medicine

## 2021-07-11 ENCOUNTER — Other Ambulatory Visit: Payer: Self-pay

## 2021-07-11 ENCOUNTER — Emergency Department (HOSPITAL_BASED_OUTPATIENT_CLINIC_OR_DEPARTMENT_OTHER)
Admission: EM | Admit: 2021-07-11 | Discharge: 2021-07-11 | Disposition: A | Discharge status: Home / Self Care | Payer: PRIVATE HEALTH INSURANCE | Attending: Emergency Medicine | Admitting: Emergency Medicine

## 2021-07-11 DIAGNOSIS — M79645 Pain in left finger(s): Secondary | ICD-10-CM | POA: Diagnosis not present

## 2021-07-11 DIAGNOSIS — M7989 Other specified soft tissue disorders: Secondary | ICD-10-CM | POA: Insufficient documentation

## 2021-07-11 DIAGNOSIS — M25542 Pain in joints of left hand: Secondary | ICD-10-CM | POA: Diagnosis present

## 2021-07-11 NOTE — ED Provider Notes (Signed)
[facility] EMERGENCY DEPARTMENT Provider Note   CSN: [id number] Arrival date & time: 07/11/21  1046     History  Chief Complaint  Patient presents with   Hand Pain    Elijah Wood is a 51 y.o. male presenting with 2 weeks worth of left hand pain.  Reports it is mostly localized to the first metacarpal and proximal phalanx.  No injury.  Denies any numbness or tingling but endorsing pain.  Worse when he hits it on something.  Pain has not been progressing over the 2 weeks but is not getting better.  800 mg ibuprofen have not been helping.  Says "I have my good days and bad days."  Works in [employer].  Does not bite his nails or remember any bug bite.  No warmth to palpation.   Hand Pain      Home Medications Prior to Admission medications   Medication Sig Start Date End Date Taking? Authorizing Provider  HYDROcodone-acetaminophen (NORCO) 5-325 MG tablet Take 1 tablet by mouth every 6 (six) hours as needed (for pain). Patient not taking: Reported on 11/01/2017 08/23/17   Molpus, Jenny Reichmann, MD  moxifloxacin (VIGAMOX) 0.5 % ophthalmic solution Place 1 drop into both eyes 3 (three) times daily. 10/25/18   Ward, Ozella Almond, PA-C  oxyCODONE (ROXICODONE) 5 MG immediate release tablet Take 1 tablet (5 mg total) by mouth every 6 (six) hours as needed for breakthrough pain. 09/04/20   Lennice Sites, DO      Allergies    Patient has no known allergies.    Review of Systems   Review of Systems  Physical Exam Updated Vital Signs BP (!) 154/89 (BP Location: Right Arm)   Pulse 60   Temp 98.6 F (37 C) (Oral)   Resp 17   Ht 6\' 2"  (1.88 m)   Wt 97.5 kg   SpO2 99%   BMI 27.60 kg/m  Physical Exam Vitals and nursing note reviewed.  Constitutional:      Appearance: Normal appearance.  HENT:     Head: Normocephalic and atraumatic.  Eyes:     General: No scleral icterus.    Conjunctiva/sclera: Conjunctivae normal.  Pulmonary:     Effort: Pulmonary effort is normal. No  respiratory distress.  Musculoskeletal:     Comments: Full range of motion of all left hand digits.  Tenderness over the first MCP and distal metacarpal.  No trigger finger.  No boutonniere or swan-neck deformities.  No felon or paronychia.  Very subtle swelling from the tip of the finger down to the MCP.  No tenderness over the thumb, third, fourth or fifth digits.  Negative Finkelstein's  Skin:    Findings: No rash.  Neurological:     Mental Status: He is alert.  Psychiatric:        Mood and Affect: Mood normal.    ED Results / Procedures / Treatments   Labs (all labs ordered are listed, but only abnormal results are displayed) Labs Reviewed - No data to display  EKG None  Radiology DG Hand Complete Left  Result Date: 07/11/2021 CLINICAL DATA:  1st index pain EXAM: LEFT HAND - COMPLETE 3+ VIEW COMPARISON:  None Available. FINDINGS: No acute fracture or dislocation. Joint spaces and alignment are maintained. No area of erosion or osseous destruction. No unexpected radiopaque foreign body. Soft tissues are unremarkable. IMPRESSION: No acute fracture or dislocation. Electronically Signed   By: Valentino Saxon M.D.   On: 07/11/2021 11:57    Procedures Procedures  Medications Ordered in ED Medications - No data to display  ED Course/ Medical Decision Making/ A&P                           Medical Decision Making Amount and/or Complexity of Data Reviewed Radiology: ordered.   51 year old male presenting with nontraumatic left index finger pain.  Differential includes but is not limited to tenosynovitis, osteoarthritis, rheumatoid arthritis, felon, paronychia, trigger finger.  This is not exhaustive.  Physical exam: Neurovascularly intact.  No signs of felon or paronychia.  No drainable pocket of fluid.  No overlying cellulitis.  Mild swelling throughout the digit and tenderness over the MCP.  Imaging: I ordered, viewed and individually interpreted patient's left hand x-ray.   This showed no abnormalities.  MDM/disposition: Patient will be discharged home with follow-up with orthopedics over the hand.  He will be given a finger splint and instructed to wear or buddy tape his fingers during the day.  He will ice and use NSAIDs for treatment.  Return precautions discussed and he left the department neurovascularly intact.  Final Clinical Impression(s) / ED Diagnoses Final diagnoses:  Finger pain, left    Rx / DC Orders   Results and diagnoses were explained to the patient. Return precautions discussed in full. Patient had no additional questions and expressed complete understanding.   This chart was dictated using voice recognition software.  Despite best efforts to proofread,  errors can occur which can change the documentation meaning.    Rhae Hammock, PA-C 07/11/21 [place]oultrie, Ankit, MD 07/12/21 [phone]

## 2021-07-11 NOTE — ED Triage Notes (Signed)
Pt c/o pain and swelling to left pointer finger x 2 weeks. Pt denies injury.

## 2021-07-11 NOTE — Discharge Instructions (Addendum)
Follow-up with the either of the doctors attached to these discharge papers about your pain if it continues over the next week.  Do your best to rest this finger and always keep it buddy taped or in a splint while at work.  Ibuprofen and Tylenol are safe to use in alternation

## 2022-08-02 ENCOUNTER — Other Ambulatory Visit: Payer: Self-pay

## 2022-08-02 ENCOUNTER — Emergency Department (HOSPITAL_BASED_OUTPATIENT_CLINIC_OR_DEPARTMENT_OTHER)
Admission: EM | Admit: 2022-08-02 | Discharge: 2022-08-02 | Disposition: A | Discharge status: Home / Self Care | Payer: PRIVATE HEALTH INSURANCE | Attending: Emergency Medicine | Admitting: Emergency Medicine

## 2022-08-02 DIAGNOSIS — Z202 Contact with and (suspected) exposure to infections with a predominantly sexual mode of transmission: Secondary | ICD-10-CM | POA: Insufficient documentation

## 2022-08-02 LAB — HIV ANTIBODY (ROUTINE TESTING W REFLEX): HIV Screen 4th Generation wRfx: NONREACTIVE

## 2022-08-02 NOTE — ED Provider Notes (Signed)
[place] AT [facility] Provider Note   CSN: [id number] Arrival date & time: 08/02/22  1512     History  Chief Complaint  Patient presents with   Exposure to STD    Elijah Wood is a 52 y.o. male.  Who presents to the ED for evaluation of a possible STD exposure.  He states his significant other informed him that she was diagnosed with trichomonas recently.  He is in a monogamous relationship with 1 male partner.  They do not use protection.  He denies any symptoms.  Specifically denies dysuria, frequency, urgency, penile pain or rashes, pubic rashes, testicle pain.  He would like to be tested for STDs today.   Exposure to STD       Home Medications Prior to Admission medications   Medication Sig Start Date End Date Taking? Authorizing Provider  HYDROcodone-acetaminophen (NORCO) 5-325 MG tablet Take 1 tablet by mouth every 6 (six) hours as needed (for pain). Patient not taking: Reported on 11/01/2017 08/23/17   Molpus, Jonny Ruiz, MD  moxifloxacin (VIGAMOX) 0.5 % ophthalmic solution Place 1 drop into both eyes 3 (three) times daily. 10/25/18   Ward, Chase Picket, PA-C  oxyCODONE (ROXICODONE) 5 MG immediate release tablet Take 1 tablet (5 mg total) by mouth every 6 (six) hours as needed for breakthrough pain. 09/04/20   Virgina Norfolk, DO      Allergies    Patient has no known allergies.    Review of Systems   Review of Systems  All other systems reviewed and are negative.   Physical Exam Updated Vital Signs BP (!) 155/102   Pulse 91   Temp 98.8 F (37.1 C) (Oral)   Resp 16   Ht 6\' 1"  (1.854 m)   Wt 95.7 kg   SpO2 98%   BMI 27.82 kg/m  Physical Exam Vitals and nursing note reviewed.  Constitutional:      General: He is not in acute distress.    Appearance: Normal appearance. He is normal weight. He is not ill-appearing.  HENT:     Head: Normocephalic and atraumatic.  Pulmonary:     Effort: Pulmonary effort is normal. No  respiratory distress.  Abdominal:     General: Abdomen is flat.  Genitourinary:    Comments: GU exam declined Musculoskeletal:        General: Normal range of motion.     Cervical back: Neck supple.  Skin:    General: Skin is warm and dry.  Neurological:     Mental Status: He is alert and oriented to person, place, and time.  Psychiatric:        Mood and Affect: Mood normal.        Behavior: Behavior normal.     ED Results / Procedures / Treatments   Labs (all labs ordered are listed, but only abnormal results are displayed) Labs Reviewed  RPR  HIV ANTIBODY (ROUTINE TESTING W REFLEX)  GC/CHLAMYDIA PROBE AMP ([place]) NOT AT [facility]    EKG None  Radiology No results found.  Procedures Procedures    Medications Ordered in ED Medications - No data to display  ED Course/ Medical Decision Making/ A&P                             Medical Decision Making Amount and/or Complexity of Data Reviewed Labs: ordered.  This patient presents to the ED for concern of possible STD exposure, this  involves an extensive number of treatment options  My initial workup includes STD panel  Additional history obtained from: Nursing notes from this visit.  I ordered, reviewed and interpreted labs which include: Urine GC/chlamydia, RPR, HIV  Afebrile, hemodynamically stable.  52 year old male presenting to the ED for evaluation of possible STD exposure.  States his significant other was recently diagnosed with trichomonas and he would like to be tested for STDs today. He denies any symptoms.  He denies any rashes or lesions.  Will test for GC/chlamydia as well as HIV and syphilis.  Will defer empiric treatment given his lack of symptoms.  He was encouraged to follow-up his results on his MyChart and seek treatment if positive.  Stable at discharge.  At this time there does not appear to be any evidence of an acute emergency medical condition and the patient appears stable for discharge  with appropriate outpatient follow up. Diagnosis was discussed with patient who verbalizes understanding of care plan and is agreeable to discharge. I have discussed return precautions with patient who verbalizes understanding. Patient encouraged to follow-up with their PCP as needed. All questions answered.  Note: Portions of this report may have been transcribed using voice recognition software. Every effort was made to ensure accuracy; however, inadvertent computerized transcription errors may still be present.        Final Clinical Impression(s) / ED Diagnoses Final diagnoses:  Possible exposure to STD    Rx / DC Orders ED Discharge Orders     None         Michelle Piper, PA-C 08/02/22 1722    Franne Forts, DO 08/10/22 [phone]

## 2022-08-02 NOTE — ED Triage Notes (Signed)
Patient presents to ED via POV from home. Here requesting an STD check. Reports "my friend told me to come in and get checked". Denies symptoms.

## 2022-08-02 NOTE — Discharge Instructions (Signed)
You have been seen today for your complaint of possible exposure to STD. Your lab work can be reviewed in your MyChart in the next 2 to 3 days. Follow up with: A primary care provider if you test positive for anything At this time there does not appear to be the presence of an emergent medical condition, however there is always the potential for conditions to change. Please read and follow the below instructions.  Do not take your medicine if  develop an itchy rash, swelling in your mouth or lips, or difficulty breathing; call 911 and seek immediate emergency medical attention if this occurs.  You may review your lab tests and imaging results in their entirety on your MyChart account.  Please discuss all results of fully with your primary care provider and other specialist at your follow-up visit.  Note: Portions of this text may have been transcribed using voice recognition software. Every effort was made to ensure accuracy; however, inadvertent computerized transcription errors may still be present.

## 2022-08-03 LAB — GC/CHLAMYDIA PROBE AMP (~~LOC~~) NOT AT ARMC
Chlamydia: NEGATIVE
Comment: NEGATIVE
Comment: NORMAL
Neisseria Gonorrhea: NEGATIVE

## 2022-08-03 LAB — RPR: RPR Ser Ql: NONREACTIVE

## 2022-08-05 ENCOUNTER — Telehealth (HOSPITAL_COMMUNITY): Payer: Self-pay

## 2022-08-07 ENCOUNTER — Encounter (HOSPITAL_BASED_OUTPATIENT_CLINIC_OR_DEPARTMENT_OTHER): Payer: Self-pay

## 2022-08-07 ENCOUNTER — Other Ambulatory Visit: Payer: Self-pay

## 2022-08-07 ENCOUNTER — Emergency Department (HOSPITAL_BASED_OUTPATIENT_CLINIC_OR_DEPARTMENT_OTHER)
Admission: EM | Admit: 2022-08-07 | Discharge: 2022-08-07 | Disposition: A | Discharge status: Home / Self Care | Payer: PRIVATE HEALTH INSURANCE | Attending: Emergency Medicine | Admitting: Emergency Medicine

## 2022-08-07 DIAGNOSIS — Z202 Contact with and (suspected) exposure to infections with a predominantly sexual mode of transmission: Secondary | ICD-10-CM | POA: Insufficient documentation

## 2022-08-07 MED ORDER — METRONIDAZOLE 500 MG PO TABS
2000.0000 mg | ORAL_TABLET | Freq: Once | ORAL | Status: AC
  Administered 2022-08-07: 2000 mg via ORAL
  Filled 2022-08-07: qty 4

## 2022-08-07 NOTE — Discharge Instructions (Addendum)
You have been treated for trichomonas.

## 2022-08-07 NOTE — ED Notes (Signed)
Pt discharged to home using teachback Method. Discharge instructions have been discussed with patient and/or family members. Pt verbally acknowledges understanding d/c instructions, has been given opportunity for questions to be answered, and endorses comprehension to checkout at registration before leaving.  

## 2022-08-07 NOTE — ED Triage Notes (Signed)
Pt was here the other day for STD testing and wanted to be tested for trichomoniasis but did not see any results for it in his MyChart. No complaints.

## 2022-08-07 NOTE — ED Provider Notes (Signed)
  [facility] Provider Note   CSN: [id number] Arrival date & time: 08/07/22  1337     History  Chief Complaint  Patient presents with   Exposure to STD    Elijah Wood is a 52 y.o. male.  Patient here after being exposed to trichomonas.  He had STD testing done few days ago but he could not see his results in the chart.  Patient denies any symptoms.  No chest pain or shortness of breath or weakness or numbness or tingling.  The history is provided by the patient.       Home Medications Prior to Admission medications   Medication Sig Start Date End Date Taking? Authorizing Provider  HYDROcodone-acetaminophen (NORCO) 5-325 MG tablet Take 1 tablet by mouth every 6 (six) hours as needed (for pain). Patient not taking: Reported on 11/01/2017 08/23/17   Molpus, Jonny Ruiz, MD  moxifloxacin (VIGAMOX) 0.5 % ophthalmic solution Place 1 drop into both eyes 3 (three) times daily. 10/25/18   Ward, Chase Picket, PA-C  oxyCODONE (ROXICODONE) 5 MG immediate release tablet Take 1 tablet (5 mg total) by mouth every 6 (six) hours as needed for breakthrough pain. 09/04/20   Virgina Norfolk, DO      Allergies    Patient has no known allergies.    Review of Systems   Review of Systems  Physical Exam Updated Vital Signs BP (!) 141/91 (BP Location: Right Arm)   Pulse 68   Temp 98 F (36.7 C) (Oral)   Resp 18   SpO2 100%  Physical Exam Cardiovascular:     Pulses: Normal pulses.  Pulmonary:     Effort: Pulmonary effort is normal.  Skin:    General: Skin is warm.  Neurological:     Mental Status: He is alert.     ED Results / Procedures / Treatments   Labs (all labs ordered are listed, but only abnormal results are displayed) Labs Reviewed  URINE CYTOLOGY ANCILLARY ONLY    EKG None  Radiology No results found.  Procedures Procedures    Medications Ordered in ED Medications  metroNIDAZOLE (FLAGYL) tablet 2,000 mg (2,000 mg Oral  Given 08/07/22 1402)    ED Course/ Medical Decision Making/ A&P                             Medical Decision Making Risk Prescription drug management.   Elijah Wood is here for treatment for trichomonas.  Had exposure.  Was tested for STDs a few days ago but did not include trichomonas.  Patient had negative gonorrhea and chlamydia tested and HIV testing.  Overall we will treat with Flagyl.  Recommend safe sex practices.  He is not having any symptoms.  Discharged in good condition.  This chart was dictated using voice recognition software.  Despite best efforts to proofread,  errors can occur which can change the documentation meaning.         Final Clinical Impression(s) / ED Diagnoses Final diagnoses:  STD exposure    Rx / DC Orders ED Discharge Orders     None         Virgina Norfolk, DO 08/07/22 1424

## 2022-08-08 LAB — URINE CYTOLOGY ANCILLARY ONLY
Comment: NEGATIVE
Trichomonas: NEGATIVE

## 2022-09-02 ENCOUNTER — Other Ambulatory Visit: Payer: Self-pay

## 2022-09-02 ENCOUNTER — Emergency Department (HOSPITAL_BASED_OUTPATIENT_CLINIC_OR_DEPARTMENT_OTHER)
Admission: EM | Admit: 2022-09-02 | Discharge: 2022-09-02 | Disposition: A | Discharge status: Home / Self Care | Payer: Commercial Managed Care - PPO | Attending: Emergency Medicine | Admitting: Emergency Medicine

## 2022-09-02 ENCOUNTER — Encounter (HOSPITAL_BASED_OUTPATIENT_CLINIC_OR_DEPARTMENT_OTHER): Payer: Self-pay

## 2022-09-02 DIAGNOSIS — K047 Periapical abscess without sinus: Secondary | ICD-10-CM | POA: Insufficient documentation

## 2022-09-02 DIAGNOSIS — K0889 Other specified disorders of teeth and supporting structures: Secondary | ICD-10-CM | POA: Diagnosis present

## 2022-09-02 MED ORDER — AMOXICILLIN-POT CLAVULANATE 875-125 MG PO TABS: 1.0000 | ORAL_TABLET | Freq: Two times a day (BID) | ORAL | 0 refills | Status: AC

## 2022-09-02 MED ORDER — HYDROCODONE-ACETAMINOPHEN 5-325 MG PO TABS: 1.0000 | ORAL_TABLET | ORAL | 0 refills | Status: DC | PRN

## 2022-09-02 NOTE — ED Notes (Signed)
Reviewed discharge instructions and medication with pt. Pt provided community dental resources. Pt states understanding. Ambulatory at discharge

## 2022-09-02 NOTE — ED Provider Notes (Signed)
[place] [facility] AT [facility] Provider Note   CSN: [id number] Arrival date & time: 09/02/22  1017     History  Chief Complaint  Patient presents with   Facial Swelling    Elijah Wood is a 52 y.o. male.  The history is provided by the patient and medical records. No language interpreter was used.  Dental Pain Location:  Upper Quality:  Aching Severity:  Moderate Onset quality:  Gradual Duration:  2 days Timing:  Constant Progression:  Worsening Chronicity:  New Context: poor dentition   Context: not recent dental surgery and not trauma   Relieved by:  Nothing Worsened by:  Nothing Associated symptoms: facial pain and facial swelling   Associated symptoms: no congestion, no difficulty swallowing, no drooling, no fever, no gum swelling, no headaches, no neck pain, no neck swelling, no oral lesions and no trismus        Home Medications Prior to Admission medications   Medication Sig Start Date End Date Taking? Authorizing Provider  HYDROcodone-acetaminophen (NORCO) 5-325 MG tablet Take 1 tablet by mouth every 6 (six) hours as needed (for pain). Patient not taking: Reported on 11/01/2017 08/23/17   Molpus, Jonny Ruiz, MD  moxifloxacin (VIGAMOX) 0.5 % ophthalmic solution Place 1 drop into both eyes 3 (three) times daily. 10/25/18   Ward, Chase Picket, PA-C  oxyCODONE (ROXICODONE) 5 MG immediate release tablet Take 1 tablet (5 mg total) by mouth every 6 (six) hours as needed for breakthrough pain. 09/04/20   Virgina Norfolk, DO      Allergies    Patient has no known allergies.    Review of Systems   Review of Systems  Constitutional:  Negative for chills, fatigue and fever.  HENT:  Positive for dental problem and facial swelling. Negative for congestion, drooling, ear pain and mouth sores.   Eyes:  Negative for pain, redness and visual disturbance.  Respiratory:  Negative for cough, chest tightness, shortness of breath and wheezing.    Cardiovascular:  Negative for chest pain.  Gastrointestinal:  Negative for abdominal pain.  Musculoskeletal:  Negative for back pain and neck pain.  Skin:  Negative for wound.  Neurological:  Negative for headaches.  All other systems reviewed and are negative.   Physical Exam Updated Vital Signs BP (!) 161/100 (BP Location: Left Arm)   Pulse 88   Temp 98.2 F (36.8 C)   Resp 16   SpO2 97%  Physical Exam Vitals and nursing note reviewed.  Constitutional:      General: He is not in acute distress.    Appearance: He is well-developed. He is not ill-appearing, toxic-appearing or diaphoretic.  HENT:     Head: Atraumatic. No contusion, right periorbital erythema, left periorbital erythema or laceration.     Jaw: Tenderness present. No trismus, swelling or malocclusion.     Salivary Glands: Right salivary gland is not tender. Left salivary gland is not tender.      Comments: Tenderness to left upper jaw and left cheek.  Normal extraocular movements.  No overlying erythema.  No purulent drainage seen from poor dentition.  Uvula midline.  No evidence of PTA or RPA.  No tenderness under the jaw or in the submandibular gland area of parotid gland area or mastoid area.  No neck tenderness.    Nose: No congestion or rhinorrhea.     Mouth/Throat:     Mouth: Mucous membranes are moist.     Pharynx: No oropharyngeal exudate or posterior oropharyngeal erythema.  Eyes:     Extraocular Movements: Extraocular movements intact.     Conjunctiva/sclera: Conjunctivae normal.     Pupils: Pupils are equal, round, and reactive to light.  Cardiovascular:     Rate and Rhythm: Normal rate and regular rhythm.     Heart sounds: No murmur heard. Pulmonary:     Effort: Pulmonary effort is normal. No respiratory distress.     Breath sounds: Normal breath sounds. No wheezing, rhonchi or rales.  Chest:     Chest wall: No tenderness.  Abdominal:     Palpations: Abdomen is soft.     Tenderness: There is no  abdominal tenderness. There is no guarding or rebound.  Musculoskeletal:        General: No swelling or tenderness.     Cervical back: Neck supple. No tenderness.  Skin:    General: Skin is warm and dry.     Capillary Refill: Capillary refill takes less than 2 seconds.     Findings: No erythema or rash.  Neurological:     Mental Status: He is alert.  Psychiatric:        Mood and Affect: Mood normal.     ED Results / Procedures / Treatments   Labs (all labs ordered are listed, but only abnormal results are displayed) Labs Reviewed - No data to display  EKG None  Radiology No results found.  Procedures Procedures    Medications Ordered in ED Medications - No data to display  ED Course/ Medical Decision Making/ A&P                             Medical Decision Making   Elijah Wood is a 52 y.o. male with no significant past medical history who presents with left upper dental pain for last 2 days.  Patient reports the pain has been worsening.  He reports he had poor dentition for a while and needs to see a dentist but started having pain since yesterday.  He reports his left cheek has been hurting and is slightly swollen.  Denies difficulty swallowing or breathing and can open his mouth.  He denies any difficulty eating or drinking and denies any fevers or chills.  Denies any neck pain or neck stiffness and denies pain in his lower jaw.  Denies pain under his chin.  Denies any pain in the back of his throat.  Denies fevers, chills, congestion, cough, nausea, vomiting, or other complaints.  He reports he is going to call a dentist.  On exam, lungs clear.  Chest nontender.  Neck nontender.  Some mild tenderness on the left upper jaw but the palpated teeth do not feel loose.  He does have some gum redness but no palpable abscess seen.  No drainage or purulence seen.  Nasal exam unremarkable.  Pupillary exam unremarkable with normal extraocular movements.  No vision changes.  No  erythema seen externally.  Suspect dental infection with possible cellulitis early.  We had a shared decision-making conversation given his reassuring vital signs and exam, low suspicion for PTA, RPA, Ludwick's, or other deep infection.  Suspect this is an early dental infection.  We discussed getting CT and labs but given his well appearance we agreed to hold on this.  We do think he is followed with dentistry and will give prescription for antibiotics.  Due to the mild discomfort, will give prescription for pain medicine as well.  Patient agrees with this plan.  He understood return precautions and follow-up instructions and was discharged in good condition.             Final Clinical Impression(s) / ED Diagnoses Final diagnoses:  Dental infection  Pain, dental    Rx / DC Orders ED Discharge Orders          Ordered    amoxicillin-clavulanate (AUGMENTIN) 875-125 MG tablet  Every 12 hours        09/02/22 1149    HYDROcodone-acetaminophen (NORCO/VICODIN) 5-325 MG tablet  Every 4 hours PRN        09/02/22 1149           Clinical Impression: 1. Dental infection   2. Pain, dental     Disposition: Discharge  Condition: Good  I have discussed the results, Dx and Tx plan with the pt(& family if present). He/she/they expressed understanding and agree(s) with the plan. Discharge instructions discussed at great length. Strict return precautions discussed and pt &/or family have verbalized understanding of the instructions. No further questions at time of discharge.    Discharge Medication List as of 09/02/2022 11:50 AM     START taking these medications   Details  amoxicillin-clavulanate (AUGMENTIN) 875-125 MG tablet Take 1 tablet by mouth every 12 (twelve) hours for 14 days., Starting Fri 09/02/2022, Until Fri 09/16/2022, Normal    !! HYDROcodone-acetaminophen (NORCO/VICODIN) 5-325 MG tablet Take 1 tablet by mouth every 4 (four) hours as needed., Starting Fri 09/02/2022, Normal      !! - Potential duplicate medications found. Please discuss with provider.      Follow Up: No follow-up provider specified.     Gwenda Heiner, Canary Brim, MD 09/02/22 [phone]

## 2022-09-02 NOTE — ED Triage Notes (Signed)
Pt presents with c/o left upper dental abscess . Noted to have swelling left side of face. Started yesterday

## 2022-09-02 NOTE — Discharge Instructions (Signed)
Your history, exam, and evaluation are consistent with dental infection/early dental abscess and you need to follow-up with a dentist.  We discussed giving antibiotics and some prescription for pain medicine.  Your exam otherwise did not show evidence of deep space neck infection or infection going towards the back of your throat.  We feel you are safe for discharge to follow-up with dentistry and primary doctor.  We agreed to hold on extensive lab and imaging testing at this time.  Please rest and stay hydrated and take the medications.  If any symptoms change or worsen acutely, please return to the nearest emergency department.

## 2022-09-23 IMAGING — DX DG HAND COMPLETE 3+V*L*
3 series · 3 of 3 positions shown · non-contrast
Comparison: None Available.

CLINICAL DATA: 1st index pain

EXAM:
LEFT HAND - COMPLETE 3+ VIEW

[hand pa]
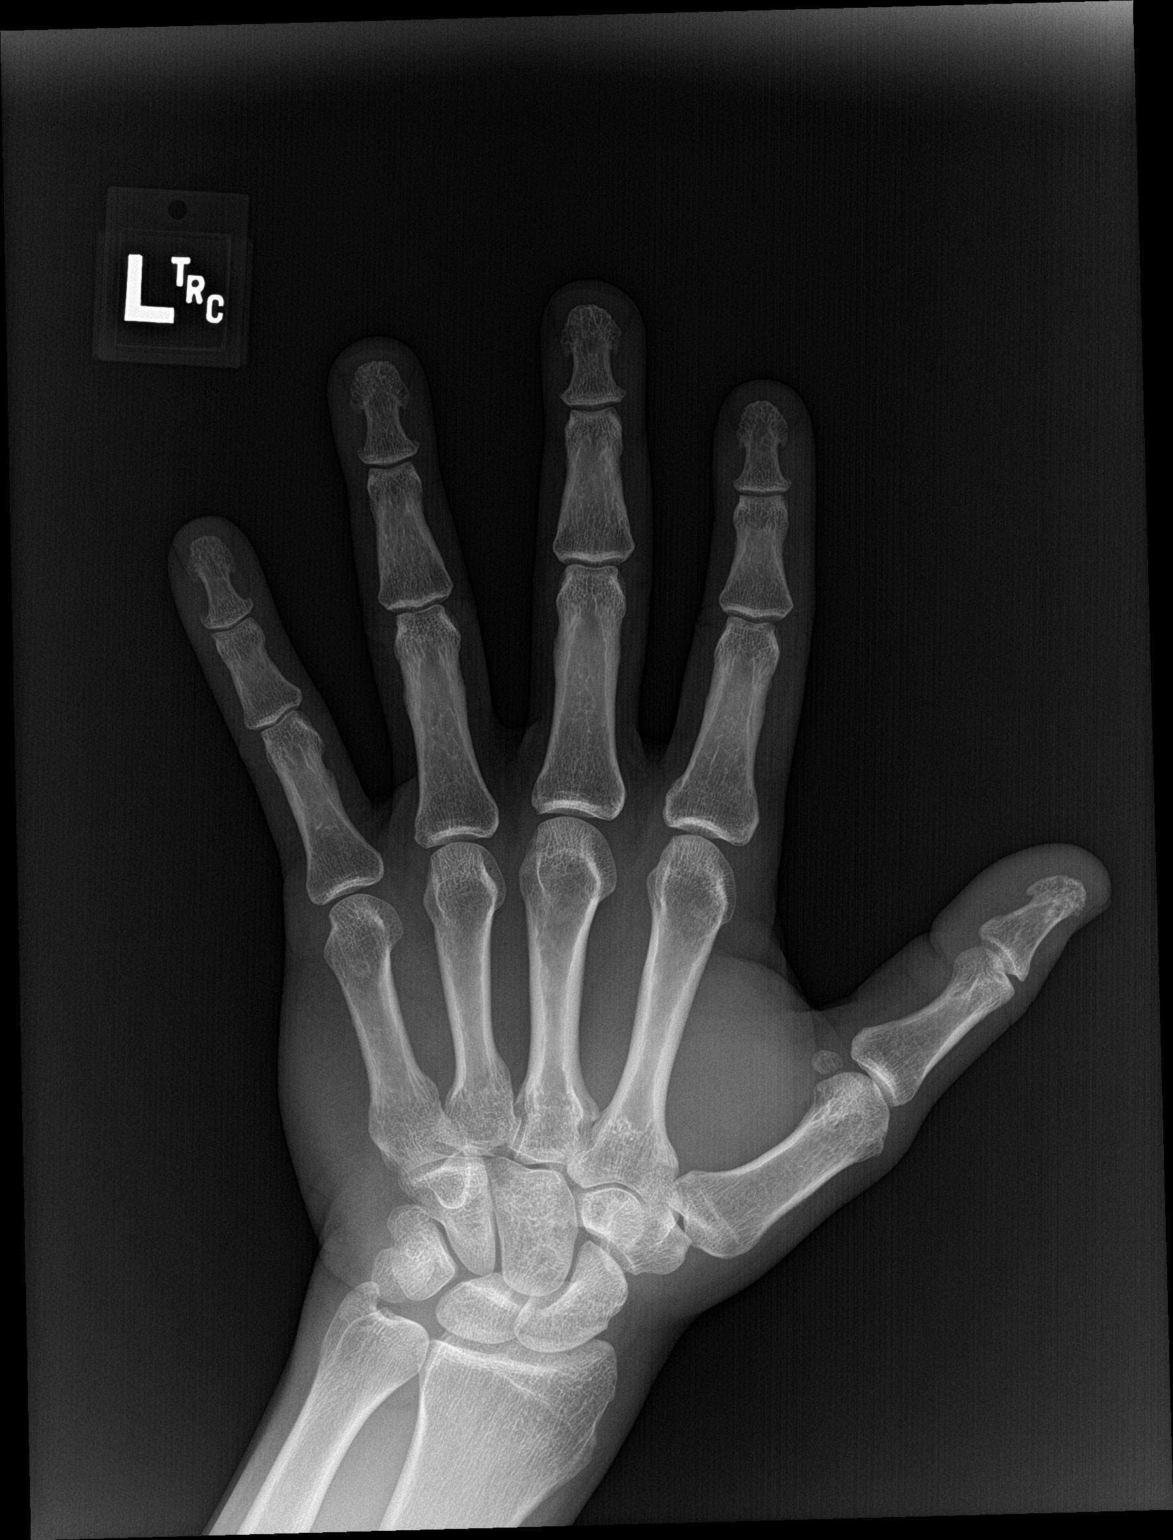

[hand obl]
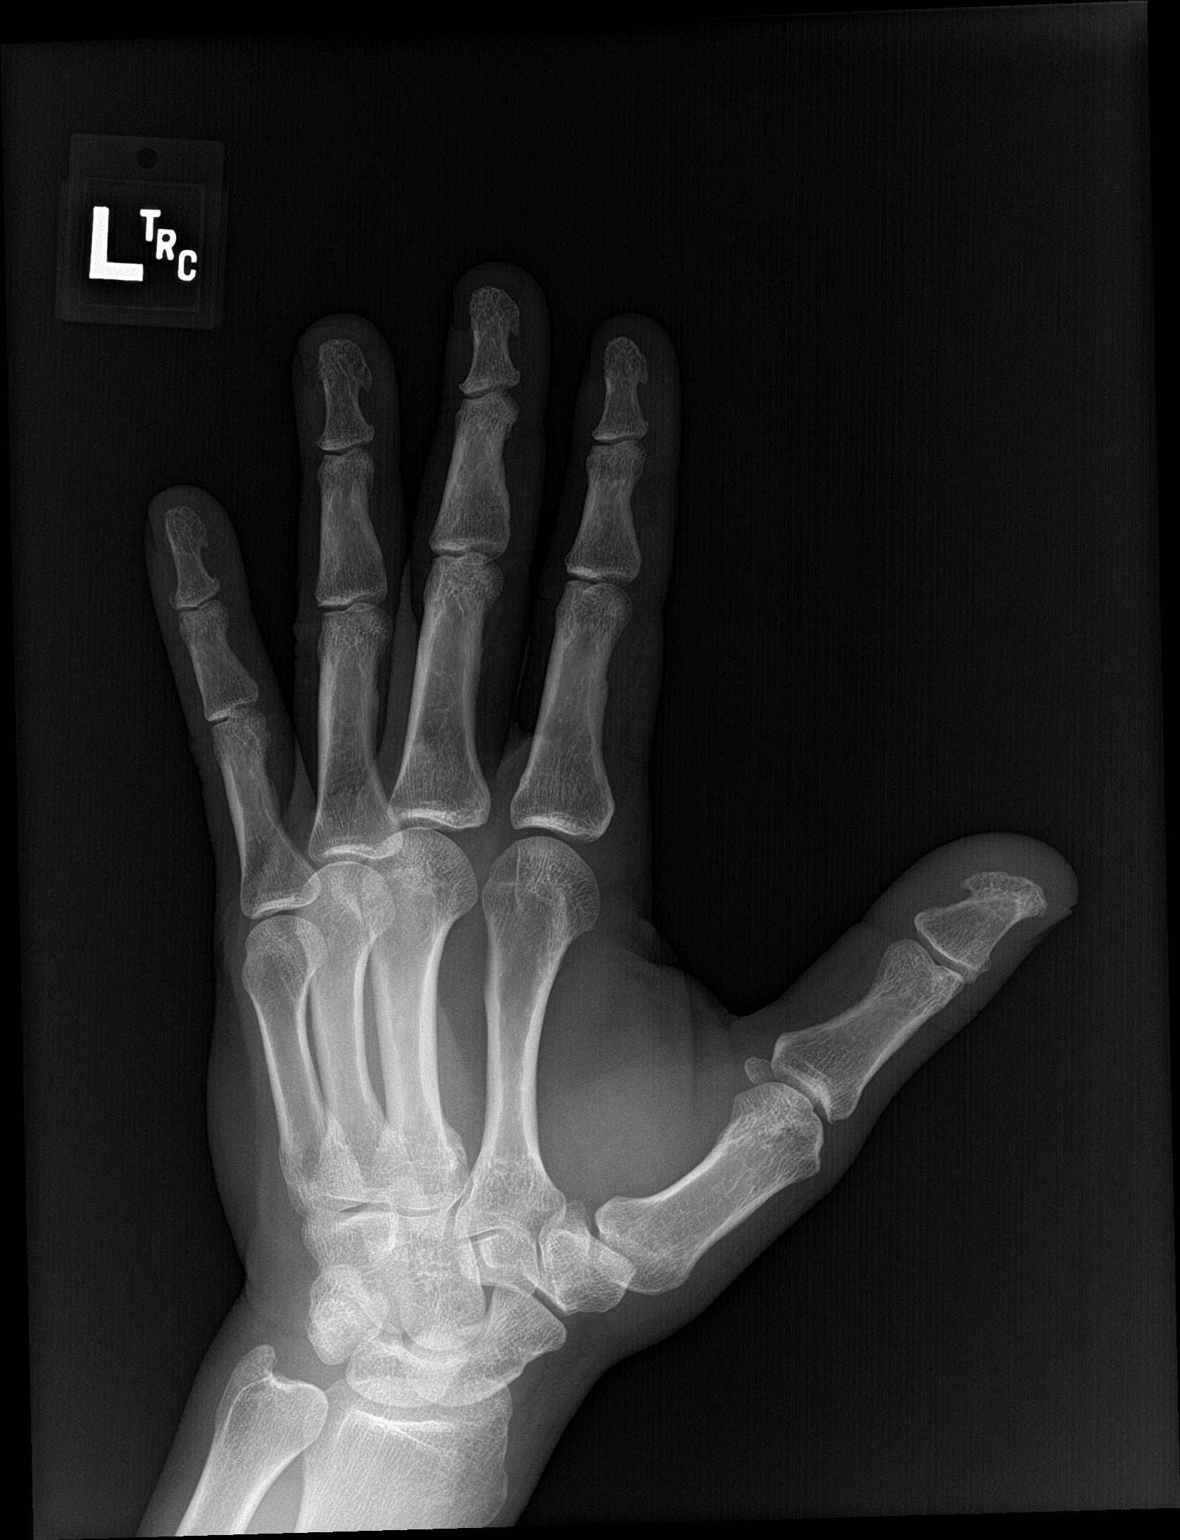

[hand lat]
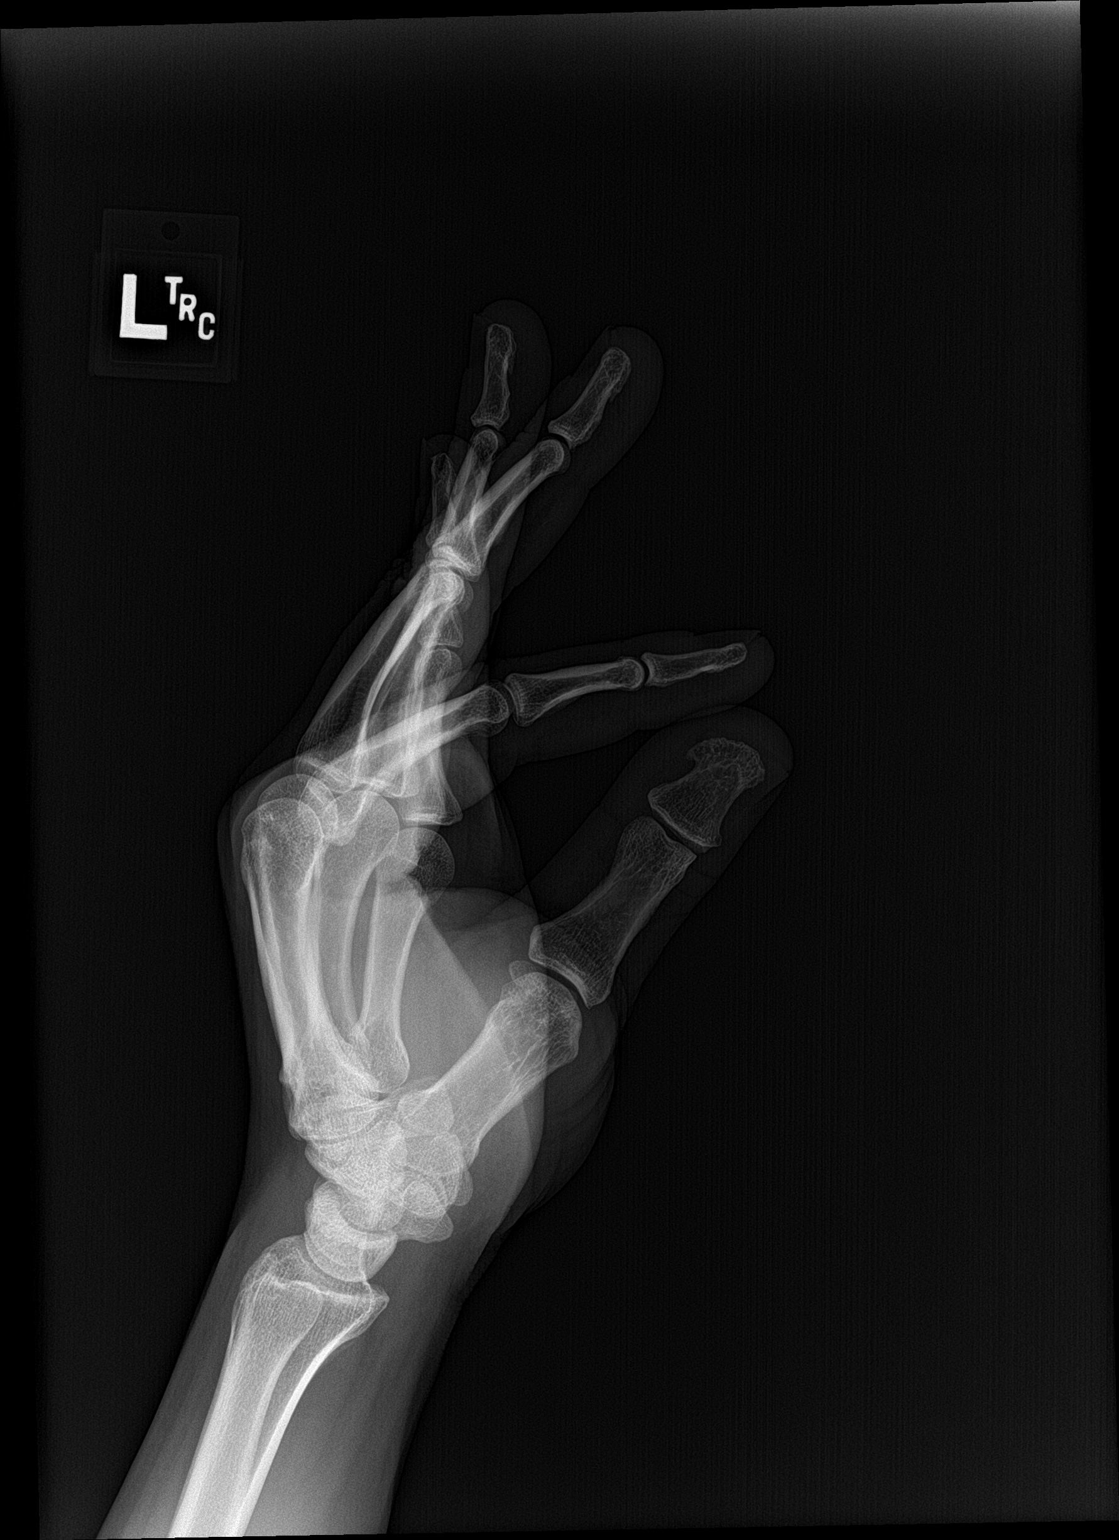

[3 of 3 positions shown; findings below may reference images not displayed]

FINDINGS: No acute fracture or dislocation. Joint spaces and alignment are
maintained. No area of erosion or osseous destruction. No unexpected
radiopaque foreign body. Soft tissues are unremarkable.
IMPRESSION: No acute fracture or dislocation.

## 2022-12-31 ENCOUNTER — Emergency Department (HOSPITAL_BASED_OUTPATIENT_CLINIC_OR_DEPARTMENT_OTHER)
Admission: EM | Admit: 2022-12-31 | Discharge: 2022-12-31 | Disposition: A | Discharge status: Home / Self Care | Payer: PRIVATE HEALTH INSURANCE | Attending: Emergency Medicine | Admitting: Emergency Medicine

## 2022-12-31 ENCOUNTER — Encounter (HOSPITAL_BASED_OUTPATIENT_CLINIC_OR_DEPARTMENT_OTHER): Payer: Self-pay | Admitting: Emergency Medicine

## 2022-12-31 ENCOUNTER — Other Ambulatory Visit: Payer: Self-pay

## 2022-12-31 DIAGNOSIS — K0889 Other specified disorders of teeth and supporting structures: Secondary | ICD-10-CM | POA: Insufficient documentation

## 2022-12-31 MED ORDER — CLINDAMYCIN HCL 300 MG PO CAPS: 300.0000 mg | ORAL_CAPSULE | Freq: Three times a day (TID) | ORAL | 0 refills | Status: AC

## 2022-12-31 MED ORDER — KETOROLAC TROMETHAMINE 60 MG/2ML IM SOLN
60.0000 mg | Freq: Once | INTRAMUSCULAR | Status: AC
  Administered 2022-12-31: 60 mg via INTRAMUSCULAR
  Filled 2022-12-31: qty 2

## 2022-12-31 NOTE — Discharge Instructions (Signed)
Take antibiotic as prescribed.  Follow-up with dentist, Dr. Lucky Cowboy.  Recommend 1000 mg of Tylenol every 6 hours as needed for pain.  Recommend 400 mg ibuprofen every 8 hours as needed for pain.

## 2022-12-31 NOTE — ED Provider Notes (Signed)
[place] [facility] Provider Note   CSN: [id number] Arrival date & time: 12/31/22  1904     History  Chief Complaint  Patient presents with   Dental Pain    Elijah Wood is a 52 y.o. male.  Patient here with right upper dental pain for the last few days.  He has multiple dental pain areas however.  But he is noticing swelling over the right upper jaw area.  No drooling, able to eat and drink well.  Does not have a dentist or Derm referral in.  Denies any fever or chills.  Has been taking ibuprofen.  The history is provided by the patient.       Home Medications Prior to Admission medications   Medication Sig Start Date End Date Taking? Authorizing Provider  clindamycin (CLEOCIN) 300 MG capsule Take 1 capsule (300 mg total) by mouth 3 (three) times daily for 10 days. 12/31/22 01/10/23 Yes Kristen Bushway, DO  HYDROcodone-acetaminophen (NORCO) 5-325 MG tablet Take 1 tablet by mouth every 6 (six) hours as needed (for pain). Patient not taking: Reported on 11/01/2017 08/23/17   Molpus, John, MD  HYDROcodone-acetaminophen (NORCO/VICODIN) 5-325 MG tablet Take 1 tablet by mouth every 4 (four) hours as needed. 09/02/22   Tegeler, Canary Brim, MD  moxifloxacin (VIGAMOX) 0.5 % ophthalmic solution Place 1 drop into both eyes 3 (three) times daily. 10/25/18   Ward, Chase Picket, PA-C  oxyCODONE (ROXICODONE) 5 MG immediate release tablet Take 1 tablet (5 mg total) by mouth every 6 (six) hours as needed for breakthrough pain. 09/04/20   Virgina Norfolk, DO      Allergies    Patient has no known allergies.    Review of Systems   Review of Systems  Physical Exam Updated Vital Signs BP (!) 168/103 (BP Location: Left Arm)   Pulse 79   Temp 98.8 F (37.1 C) (Oral)   Resp 18   Ht 6\' 2"  (1.88 m)   Wt 97.5 kg   SpO2 99%   BMI 27.60 kg/m  Physical Exam Vitals and nursing note reviewed.  Constitutional:      General: He is not in acute distress.     Appearance: He is well-developed.  HENT:     Head: Normocephalic and atraumatic.     Mouth/Throat:     Comments: Dental caries throughout, mild swelling over the right upper mandible with very localized send there is no major swelling around the eye or maxillary area Eyes:     Conjunctiva/sclera: Conjunctivae normal.  Pulmonary:     Effort: Pulmonary effort is normal.  Musculoskeletal:        General: Swelling present.     Cervical back: Neck supple.  Neurological:     Mental Status: He is alert.  Psychiatric:        Mood and Affect: Mood normal.     ED Results / Procedures / Treatments   Labs (all labs ordered are listed, but only abnormal results are displayed) Labs Reviewed - No data to display  EKG None  Radiology No results found.  Procedures Procedures    Medications Ordered in ED Medications - No data to display  ED Course/ Medical Decision Making/ A&P                                 Medical Decision Making Risk Prescription drug management.   Elijah Wood is here with  dental pain.  Multiple dental caries on exam.  Does have little bit of fullness to the right upper mandible area but very localized.  I have no concern for major infectious process or abscess.  There is no trismus or drooling.  No submandibular swelling.  Will start him on clindamycin and have provided information to follow-up with dentistry.  He understands return precautions.  Discharged in good condition.  This chart was dictated using voice recognition software.  Despite best efforts to proofread,  errors can occur which can change the documentation meaning.         Final Clinical Impression(s) / ED Diagnoses Final diagnoses:  Pain, dental    Rx / DC Orders ED Discharge Orders          Ordered    clindamycin (CLEOCIN) 300 MG capsule  3 times daily        12/31/22 1911              Virgina Norfolk, DO 12/31/22 1915

## 2022-12-31 NOTE — ED Triage Notes (Signed)
Front upper dental pain since Thursday. Mild right sided facial swelling.

## 2023-06-24 ENCOUNTER — Encounter (HOSPITAL_BASED_OUTPATIENT_CLINIC_OR_DEPARTMENT_OTHER): Payer: Self-pay

## 2023-06-24 ENCOUNTER — Other Ambulatory Visit: Payer: Self-pay

## 2023-06-24 ENCOUNTER — Emergency Department (HOSPITAL_BASED_OUTPATIENT_CLINIC_OR_DEPARTMENT_OTHER)

## 2023-06-24 ENCOUNTER — Emergency Department (HOSPITAL_BASED_OUTPATIENT_CLINIC_OR_DEPARTMENT_OTHER)
Admission: EM | Admit: 2023-06-24 | Discharge: 2023-06-24 | Disposition: A | Discharge status: Home / Self Care | Attending: Emergency Medicine | Admitting: Emergency Medicine

## 2023-06-24 DIAGNOSIS — K047 Periapical abscess without sinus: Secondary | ICD-10-CM | POA: Diagnosis not present

## 2023-06-24 DIAGNOSIS — M778 Other enthesopathies, not elsewhere classified: Secondary | ICD-10-CM | POA: Diagnosis not present

## 2023-06-24 DIAGNOSIS — M25512 Pain in left shoulder: Secondary | ICD-10-CM | POA: Diagnosis present

## 2023-06-24 DIAGNOSIS — K029 Dental caries, unspecified: Secondary | ICD-10-CM | POA: Diagnosis not present

## 2023-06-24 DIAGNOSIS — M7582 Other shoulder lesions, left shoulder: Secondary | ICD-10-CM

## 2023-06-24 MED ORDER — KETOROLAC TROMETHAMINE 60 MG/2ML IM SOLN
60.0000 mg | Freq: Once | INTRAMUSCULAR | Status: AC
  Administered 2023-06-24: 60 mg via INTRAMUSCULAR
  Filled 2023-06-24: qty 2

## 2023-06-24 MED ORDER — CYCLOBENZAPRINE HCL 10 MG PO TABS: 10.0000 mg | ORAL_TABLET | Freq: Two times a day (BID) | ORAL | 0 refills | Status: AC | PRN

## 2023-06-24 MED ORDER — LIDOCAINE-EPINEPHRINE (PF) 2 %-1:200000 IJ SOLN
10.0000 mL | Freq: Once | INTRAMUSCULAR | Status: AC
  Administered 2023-06-24: 10 mL via INTRADERMAL
  Filled 2023-06-24: qty 20

## 2023-06-24 MED ORDER — LIDOCAINE 5 % EX PTCH: 1.0000 | MEDICATED_PATCH | CUTANEOUS | 0 refills | Status: DC

## 2023-06-24 MED ORDER — AMOXICILLIN-POT CLAVULANATE 875-125 MG PO TABS: 1.0000 | ORAL_TABLET | Freq: Two times a day (BID) | ORAL | 0 refills | Status: AC

## 2023-06-24 NOTE — ED Provider Notes (Signed)
[place] [facility] Provider Note   CSN: [id number] Arrival date & time: 06/24/23  7425     History  Chief Complaint  Patient presents with   Oral Swelling   Shoulder Pain    Elijah Wood is a 53 y.o. male.  HPI     Left shoulder pain on and off, worse in the last 2 weeks.  History of some rotator cuff problems. Does some lifting at work.  Now hurts even just small movements of shoulder. Does have to use shoulders at work.  Some days can't pick up daughter because of pain in the left shoulder with movement and lifting.  No chest pain or shortness of breath. No fevers.  No abdominal pain.    Throbbing Thursday night and then yesterday morning began to have swelling. Has dental work appointment next week and then this happened.  No difficulty breathing swallowing.  Left lower jaw swelling. Not on abx now.  Pain in mouth is not severe but the shoulder pain is severe.  20 sometimes.  With movement.  15 years ago was last time had problem  History reviewed. No pertinent past medical history.  Home Medications Prior to Admission medications   Medication Sig Start Date End Date Taking? Authorizing Provider  amoxicillin -clavulanate (AUGMENTIN ) 875-125 MG tablet Take 1 tablet by mouth every 12 (twelve) hours for 7 days. 06/24/23 07/01/23 Yes Scarlette Currier, MD  cyclobenzaprine (FLEXERIL) 10 MG tablet Take 1 tablet (10 mg total) by mouth 2 (two) times daily as needed for muscle spasms. 06/24/23  Yes Scarlette Currier, MD  lidocaine (LIDODERM) 5 % Place 1 patch onto the skin daily. Remove & Discard patch within 12 hours or as directed by MD 06/24/23  Yes Scarlette Currier, MD  HYDROcodone -acetaminophen  (NORCO) 5-325 MG tablet Take 1 tablet by mouth every 6 (six) hours as needed (for pain). Patient not taking: Reported on 11/01/2017 08/23/17   Molpus, John, MD  HYDROcodone -acetaminophen  (NORCO/VICODIN) 5-325 MG tablet Take 1 tablet by mouth every 4 (four)  hours as needed. 09/02/22   Tegeler, Marine Sia, MD  moxifloxacin  (VIGAMOX ) 0.5 % ophthalmic solution Place 1 drop into both eyes 3 (three) times daily. 10/25/18   Ward, Veto Gowda, PA-C  oxyCODONE  (ROXICODONE ) 5 MG immediate release tablet Take 1 tablet (5 mg total) by mouth every 6 (six) hours as needed for breakthrough pain. 09/04/20   Lowery Rue, DO      Allergies    Patient has no known allergies.    Review of Systems   Review of Systems  Physical Exam Updated Vital Signs BP (!) 143/90   Pulse 64   Resp 17   Ht 6\' 2"  (1.88 m)   Wt 97.5 kg   SpO2 99%   BMI 27.60 kg/m  Physical Exam Vitals and nursing note reviewed.  Constitutional:      General: He is not in acute distress.    Appearance: He is well-developed. He is not diaphoretic.  HENT:     Head: Normocephalic and atraumatic.     Comments: Dental caries, left lower molar with area swelling inferiorly, fluctuance and tenderness approx 1.5cm No trismus No sublingual swelling Eyes:     Conjunctiva/sclera: Conjunctivae normal.  Cardiovascular:     Rate and Rhythm: Normal rate and regular rhythm.     Pulses: Normal pulses.  Pulmonary:     Effort: Pulmonary effort is normal. No respiratory distress.  Abdominal:     General: There is no distension.  Palpations: Abdomen is soft.     Tenderness: There is no abdominal tenderness. There is no guarding.  Musculoskeletal:        General: Tenderness (posterior shoulder) present.     Cervical back: Normal range of motion.     Comments: Pain with shoulder abduction, +empty can    Skin:    General: Skin is warm and dry.  Neurological:     Mental Status: He is alert and oriented to person, place, and time.     ED Results / Procedures / Treatments   Labs (all labs ordered are listed, but only abnormal results are displayed) Labs Reviewed - No data to display  EKG None  Radiology DG Shoulder Left Result Date: 06/24/2023 CLINICAL DATA:  Chronic left  shoulder pain without known injury. EXAM: LEFT SHOULDER - 2+ VIEW COMPARISON:  None Available. FINDINGS: There is no evidence of fracture or dislocation. There is no evidence of arthropathy or other focal bone abnormality. Soft tissues are unremarkable. IMPRESSION: Negative. Electronically Signed   By: Rosalene Colon M.D.   On: 06/24/2023 09:03    Procedures Procedures    Medications Ordered in ED Medications  ketorolac  (TORADOL ) injection 60 mg (60 mg Intramuscular Given 06/24/23 0829)  lidocaine-EPINEPHrine (XYLOCAINE W/EPI) 2 %-1:200000 (PF) injection 10 mL (10 mLs Intradermal Given by Other 06/24/23 4098)    ED Course/ Medical Decision Making/ A&P                                  53yo male presents with concern for left shoulder pain and dental pain/swelling.  Regarding left shoulder: Pain and is significantly worse with movements, suggesting a musculoskeletal etiology.  He denies any chest pain, shortness of breath, low suspicion for ACS, normal pulses bilaterally low suspicion for acute arterial thrombus, dissection.  No abdominal pain to suggest intra-abdominal etiology of pain.  He does not have fever, while he does have pain, his range of motion is overall not limited and have low suspicion for septic arthritis.  Suspect likely rotator cuff pathology given history of repetitive shoulder movements, similar history in the past, pain specifically with shoulder abduction.  Will give a shoulder sling and recommend follow-up with orthopedics for further evaluation of musculoskeletal shoulder pain.  XR without signs of fracture nor dislocation   Regarding dental pain and swelling--- no trismus, no sign of extension into facial muscles, nor signs of Ludwig's angina.  He does have a dental abscess that is present on exam.  Performed incision and drainage of abscess with scant purulence.  Given a prescription for Augmentin .  Recommend warm salt water rinses, close dental follow up.             Final Clinical Impression(s) / ED Diagnoses Final diagnoses:  Acute pain of left shoulder  Rotator cuff tendinitis, left  Dental abscess    Rx / DC Orders ED Discharge Orders          Ordered    amoxicillin -clavulanate (AUGMENTIN ) 875-125 MG tablet  Every 12 hours        06/24/23 0857    cyclobenzaprine (FLEXERIL) 10 MG tablet  2 times daily PRN        06/24/23 0857    lidocaine (LIDODERM) 5 %  Every 24 hours        06/24/23 0857              Scarlette Currier, MD 06/24/23  0919  

## 2023-06-24 NOTE — ED Triage Notes (Signed)
 Reports swelling to L side of mouth for 2 days. Denies difficulty swallowing, eating, drinking. Airway patent. Denies drainage from site   Reports L shoulder pain, pt thinks "he irritated shoulder at work" No known injury, states pain is worse w movement. L shoulder held lower than R during triage. No obvious swelling or deformity noted

## 2023-06-24 NOTE — ED Notes (Signed)
 Called X-ray about taking pt to have shoulder X-ray done. Per tech they will be with him shortly.   Anastacio Balm, RN

## 2023-06-24 NOTE — Discharge Instructions (Addendum)
 For your pain, you may take up to 1000mg  of acetaminophen  (tylenol ) 4 times daily for up to a week. This is the maximum dose of acetminophen (tylenol ) you can take from all sources. Please check other over-the-counter medications and prescriptions to ensure you are not taking other medications that contain acetaminophen .  You may also take ibuprofen  400 mg 6 times a day OR 600mg  4 times a day alternating with or at the same time as tylenol .  You may take these in addition to using the lidocaine patch and muscle relaxant as they have been prescribed.

## 2023-09-06 ENCOUNTER — Emergency Department (HOSPITAL_BASED_OUTPATIENT_CLINIC_OR_DEPARTMENT_OTHER)
Admission: EM | Admit: 2023-09-06 | Discharge: 2023-09-07 | Disposition: A | Discharge status: Home / Self Care | Attending: Emergency Medicine | Admitting: Emergency Medicine

## 2023-09-06 ENCOUNTER — Encounter (HOSPITAL_BASED_OUTPATIENT_CLINIC_OR_DEPARTMENT_OTHER): Payer: Self-pay | Admitting: Radiology

## 2023-09-06 ENCOUNTER — Other Ambulatory Visit: Payer: Self-pay

## 2023-09-06 DIAGNOSIS — K0889 Other specified disorders of teeth and supporting structures: Secondary | ICD-10-CM

## 2023-09-06 DIAGNOSIS — K0381 Cracked tooth: Secondary | ICD-10-CM | POA: Insufficient documentation

## 2023-09-06 HISTORY — DX: Essential (primary) hypertension: I10

## 2023-09-06 NOTE — ED Triage Notes (Signed)
 Pt states he first noticed an abscess to his right upper gum on Monday. He was scheduled to have some dental work done yesterday but couldn't due to the abscess so he didn't go to the dentist. The abscess he feels is on the inside of his upper lip.

## 2023-09-06 NOTE — ED Provider Notes (Signed)
 [facility] Provider Note   CSN: [id number] Arrival date & time: 09/06/23  2344     History Chief Complaint  Patient presents with   Dental Pain    HPI Elijah Wood is a 53 y.o. male presenting for chief complaint of dental pain. States he has a expansive history of dental disease.  Recurrent infections and was told he needed to follow-up with dentistry.  Saw dentist recently was told he would need multiple extractions and this is currently in the coordination phase.  Missed an appointment yesterday due to work.  Denies systemic fevers chills nausea vomiting syncope shortness of breath.  Patient's recorded medical, surgical, social, medication list and allergies were reviewed in the Snapshot window as part of the initial history.   Review of Systems   Review of Systems  Constitutional:  Negative for chills and fever.  HENT:  Positive for dental problem. Negative for ear pain and sore throat.   Eyes:  Negative for pain and visual disturbance.  Respiratory:  Negative for cough and shortness of breath.   Cardiovascular:  Negative for chest pain and palpitations.  Gastrointestinal:  Negative for abdominal pain and vomiting.  Genitourinary:  Negative for dysuria and hematuria.  Musculoskeletal:  Negative for arthralgias and back pain.  Skin:  Negative for color change and rash.  Neurological:  Negative for seizures and syncope.  All other systems reviewed and are negative.   Physical Exam Updated Vital Signs BP (!) 184/109 (BP Location: Left Arm)   Pulse (!) 57   Temp 98 F (36.7 C) (Oral)   Resp 18   Ht 6' 2 (1.88 m)   Wt 95.3 kg   SpO2 100%   BMI 26.96 kg/m  Physical Exam Vitals and nursing note reviewed.  Constitutional:      General: He is not in acute distress.    Appearance: He is well-developed.  HENT:     Head: Normocephalic and atraumatic.     Comments: Some mildly swollen gums along the anterior ridge.  No  palpable fluctuance, some swelling on the roof of his mouth that he states is chronic.  Advanced dental disease with multiple fractured teeth and advanced dental caries. Eyes:     Conjunctiva/sclera: Conjunctivae normal.  Cardiovascular:     Rate and Rhythm: Normal rate and regular rhythm.     Heart sounds: No murmur heard. Pulmonary:     Effort: Pulmonary effort is normal. No respiratory distress.     Breath sounds: Normal breath sounds.  Abdominal:     Palpations: Abdomen is soft.     Tenderness: There is no abdominal tenderness.  Musculoskeletal:        General: No swelling.     Cervical back: Neck supple.  Skin:    General: Skin is warm and dry.     Capillary Refill: Capillary refill takes less than 2 seconds.  Neurological:     Mental Status: He is alert.  Psychiatric:        Mood and Affect: Mood normal.      ED Course/ Medical Decision Making/ A&P    Procedures Procedures   Medications Ordered in ED Medications  amoxicillin -clavulanate (AUGMENTIN ) 875-125 MG per tablet 1 tablet (has no administration in time range)  acetaminophen  (TYLENOL ) tablet 1,000 mg (has no administration in time range)  ketorolac  (TORADOL ) 15 MG/ML injection 30 mg (has no administration in time range)   Medical Decision Making:   Elijah Wood is a 53  y.o. male who presented to the ED today with dental pain detailed above.    Complete initial physical exam performed, notably the patient was HDS in no acute distress. No obvious intraoral lesions or swelling. Poor dentition diffusely    Reviewed and confirmed nursing documentation for past medical history, family history, social history.    Initial Assessment:   With the patient's presentation of dental pain, most likely diagnosis is reversible vs irreversible pulpitis. Other diagnoses were considered including (but not limited to) ludwig's angina, osteitis, dental abscess, PTA, RPA. These are considered less likely due to history of  present illness and physical exam findings.   This is most consistent with an acute complicated illness  Initial Plan:  Symptomatic management with NSAIDS/Tylenol  Due to clinical overlap with potentially reversible pulpitis, antibiotics prescribed Patient will need definitive management with dentistry. Provided low cost/local dental resource chart provided by social work.  Disposition:  I have considered need for hospitalization, however, considering all of the above, I believe this patient is stable for discharge at this time.  Patient/family educated about specific return precautions for given chief complaint and symptoms.  Patient/family educated about follow-up with PCP and dentistry.    Patient/family expressed understanding of return precautions and need for follow-up. Patient spoken to regarding all imaging and laboratory results and appropriate follow up for these results. All education provided in verbal form with additional information in written form. Time was allowed for answering of patient questions. Patient discharged.      Clinical Impression:  1. Pain, dental      Discharge   Final Clinical Impression(s) / ED Diagnoses Final diagnoses:  Pain, dental    Rx / DC Orders ED Discharge Orders          Ordered    amoxicillin -clavulanate (AUGMENTIN ) 875-125 MG tablet  Every 12 hours        09/07/23 0003    oxyCODONE  (ROXICODONE ) 5 MG immediate release tablet  Every 6 hours PRN        09/07/23 0006              Jerral Meth, MD 09/07/23 9987

## 2023-09-07 MED ORDER — ACETAMINOPHEN 500 MG PO TABS
1000.0000 mg | ORAL_TABLET | Freq: Once | ORAL | Status: AC
  Administered 2023-09-07: 1000 mg via ORAL
  Filled 2023-09-07: qty 2

## 2023-09-07 MED ORDER — AMOXICILLIN-POT CLAVULANATE 875-125 MG PO TABS: 1.0000 | ORAL_TABLET | Freq: Two times a day (BID) | ORAL | 0 refills | Status: AC

## 2023-09-07 MED ORDER — KETOROLAC TROMETHAMINE 15 MG/ML IJ SOLN
30.0000 mg | Freq: Once | INTRAMUSCULAR | Status: AC
  Administered 2023-09-07: 30 mg via INTRAMUSCULAR
  Filled 2023-09-07: qty 2

## 2023-09-07 MED ORDER — OXYCODONE HCL 5 MG PO TABS: 5.0000 mg | ORAL_TABLET | Freq: Four times a day (QID) | ORAL | 0 refills | Status: AC | PRN

## 2023-09-07 MED ORDER — AMOXICILLIN-POT CLAVULANATE 875-125 MG PO TABS
1.0000 | ORAL_TABLET | Freq: Once | ORAL | Status: AC
  Administered 2023-09-07: 1 via ORAL
  Filled 2023-09-07: qty 1

## 2023-09-07 NOTE — ED Provider Notes (Incomplete)
  [place] [facility] AT [facility] Provider Note   CSN: [id number] Arrival date & time: 09/06/23  2344     History Chief Complaint  Patient presents with   Dental Pain    HPI Cleave Errico is a 53 y.o. male presenting for ***.   Patient's recorded medical, surgical, social, medication list and allergies were reviewed in the Snapshot window as part of the initial history.   Review of Systems   Review of Systems  Physical Exam Updated Vital Signs BP (!) 184/109 (BP Location: Left Arm)   Pulse (!) 57   Temp 98 F (36.7 C) (Oral)   Resp 18   Ht 6' 2 (1.88 m)   Wt 95.3 kg   SpO2 100%   BMI 26.96 kg/m  Physical Exam   ED Course/ Medical Decision Making/ A&P    Procedures Procedures   Medications Ordered in ED Medications - No data to display  Medical Decision Making:   Fabrizio Coolman is a 53 y.o. male who presented to the ED today with *** detailed above.    {crccomplexity:27900} Complete initial physical exam performed, notably the patient  was ***.    Reviewed and confirmed nursing documentation for past medical history, family history, social history.    Initial Assessment:   With the patient's presentation of ***, most likely diagnosis is ***. Other diagnoses were considered including (but not limited to) ***. These are considered less likely due to history of present illness and physical exam findings.   {crccopa:27899}  Initial Plan:  ***  ***Screening labs including CBC and Metabolic panel to evaluate for infectious or metabolic etiology of disease.  ***Urinalysis with reflex culture ordered to evaluate for UTI or relevant urologic/nephrologic pathology.  ***CXR to evaluate for structural/infectious intrathoracic pathology.  {crccardiactesting:32591::EKG to evaluate for cardiac pathology} Objective evaluation as below reviewed   Initial Study Results:   Laboratory  All laboratory results reviewed without evidence of  clinically relevant pathology.   ***Exceptions include: ***   ***EKG EKG was reviewed independently. Rate, rhythm, axis, intervals all examined and without medically relevant abnormality. ST segments without concerns for elevations.    Radiology:  All images reviewed independently. ***Agree with radiology report at this time.   No results found.    Consults: Case discussed with ***.   Reassessment and Plan:   ***    ***  Clinical Impression: No diagnosis found.   Data Unavailable   Final Clinical Impression(s) / ED Diagnoses Final diagnoses:  None    Rx / DC Orders ED Discharge Orders     None
